# Patient Record
Sex: Female | Born: 1935 | Race: White | Hispanic: No | Smoking: Former smoker
Health system: Southern US, Community
[De-identification: ages and names within clinical notes are randomized; demographics above are authoritative.]

## PROBLEM LIST (undated history)

## (undated) DIAGNOSIS — M7989 Other specified soft tissue disorders: Secondary | ICD-10-CM

## (undated) DIAGNOSIS — C569 Malignant neoplasm of unspecified ovary: Secondary | ICD-10-CM

## (undated) DIAGNOSIS — D649 Anemia, unspecified: Secondary | ICD-10-CM

## (undated) DIAGNOSIS — Z9289 Personal history of other medical treatment: Secondary | ICD-10-CM

## (undated) DIAGNOSIS — I82409 Acute embolism and thrombosis of unspecified deep veins of unspecified lower extremity: Secondary | ICD-10-CM

## (undated) DIAGNOSIS — A0472 Enterocolitis due to Clostridium difficile, not specified as recurrent: Secondary | ICD-10-CM

## (undated) DIAGNOSIS — M1990 Unspecified osteoarthritis, unspecified site: Secondary | ICD-10-CM

## (undated) DIAGNOSIS — I10 Essential (primary) hypertension: Secondary | ICD-10-CM

## (undated) DIAGNOSIS — E039 Hypothyroidism, unspecified: Secondary | ICD-10-CM

## (undated) DIAGNOSIS — J189 Pneumonia, unspecified organism: Secondary | ICD-10-CM

## (undated) DIAGNOSIS — J8410 Pulmonary fibrosis, unspecified: Secondary | ICD-10-CM

## (undated) DIAGNOSIS — J90 Pleural effusion, not elsewhere classified: Principal | ICD-10-CM

## (undated) HISTORY — DX: Pulmonary fibrosis, unspecified: J84.10

## (undated) HISTORY — DX: Hypothyroidism, unspecified: E03.9

## (undated) HISTORY — DX: Personal history of other medical treatment: Z92.89

## (undated) HISTORY — DX: Malignant neoplasm of unspecified ovary: C56.9

## (undated) HISTORY — DX: Essential (primary) hypertension: I10

## (undated) HISTORY — PX: LAPAROSCOPIC HYSTERECTOMY: SHX1926

## (undated) HISTORY — DX: Enterocolitis due to Clostridium difficile, not specified as recurrent: A04.72

## (undated) HISTORY — DX: Anemia, unspecified: D64.9

## (undated) HISTORY — DX: Acute embolism and thrombosis of unspecified deep veins of unspecified lower extremity: I82.409

## (undated) HISTORY — DX: Pleural effusion, not elsewhere classified: J90

## (undated) HISTORY — DX: Pneumonia, unspecified organism: J18.9

## (undated) HISTORY — DX: Other specified soft tissue disorders: M79.89

## (undated) HISTORY — DX: Unspecified osteoarthritis, unspecified site: M19.90

## (undated) HISTORY — PX: APPENDECTOMY: SHX54

---

## 2004-03-05 ENCOUNTER — Other Ambulatory Visit: Admission: RE | Admit: 2004-03-05 | Discharge: 2004-03-05 | Payer: Self-pay | Admitting: Obstetrics and Gynecology

## 2004-03-14 ENCOUNTER — Encounter: Admission: RE | Admit: 2004-03-14 | Discharge: 2004-03-14 | Payer: Self-pay | Admitting: Obstetrics and Gynecology

## 2004-08-29 ENCOUNTER — Encounter: Admission: RE | Admit: 2004-08-29 | Discharge: 2004-08-29 | Payer: Self-pay | Admitting: Internal Medicine

## 2005-03-04 ENCOUNTER — Encounter: Admission: RE | Admit: 2005-03-04 | Discharge: 2005-03-04 | Payer: Self-pay | Admitting: Internal Medicine

## 2005-05-12 ENCOUNTER — Encounter: Admission: RE | Admit: 2005-05-12 | Discharge: 2005-05-12 | Payer: Self-pay | Admitting: Obstetrics and Gynecology

## 2005-11-25 ENCOUNTER — Encounter: Admission: RE | Admit: 2005-11-25 | Discharge: 2005-11-25 | Payer: Self-pay | Admitting: Internal Medicine

## 2006-04-06 HISTORY — PX: REPLACEMENT TOTAL KNEE: SUR1224

## 2006-04-12 ENCOUNTER — Inpatient Hospital Stay (HOSPITAL_COMMUNITY): Admission: RE | Admit: 2006-04-12 | Discharge: 2006-04-16 | Payer: Self-pay | Admitting: Orthopedic Surgery

## 2006-07-15 ENCOUNTER — Other Ambulatory Visit: Admission: RE | Admit: 2006-07-15 | Discharge: 2006-07-15 | Payer: Self-pay | Admitting: Obstetrics and Gynecology

## 2006-07-28 ENCOUNTER — Encounter: Admission: RE | Admit: 2006-07-28 | Discharge: 2006-07-28 | Payer: Self-pay | Admitting: Obstetrics and Gynecology

## 2007-07-06 DIAGNOSIS — C569 Malignant neoplasm of unspecified ovary: Secondary | ICD-10-CM

## 2007-07-06 HISTORY — PX: LAPAROSCOPIC SALPINGOOPHERECTOMY: SUR795

## 2007-07-06 HISTORY — DX: Malignant neoplasm of unspecified ovary: C56.9

## 2007-07-22 ENCOUNTER — Ambulatory Visit (HOSPITAL_COMMUNITY): Admission: RE | Admit: 2007-07-22 | Discharge: 2007-07-22 | Payer: Self-pay | Admitting: Obstetrics and Gynecology

## 2007-08-01 ENCOUNTER — Ambulatory Visit (HOSPITAL_COMMUNITY): Admission: RE | Admit: 2007-08-01 | Discharge: 2007-08-01 | Payer: Self-pay | Admitting: Obstetrics and Gynecology

## 2007-08-05 HISTORY — PX: HERNIA REPAIR: SHX51

## 2007-08-16 ENCOUNTER — Encounter: Admission: RE | Admit: 2007-08-16 | Discharge: 2007-08-16 | Payer: Self-pay | Admitting: Obstetrics and Gynecology

## 2007-08-26 ENCOUNTER — Inpatient Hospital Stay (HOSPITAL_COMMUNITY): Admission: AD | Admit: 2007-08-26 | Discharge: 2007-08-30 | Payer: Self-pay | Admitting: Gastroenterology

## 2007-10-17 ENCOUNTER — Ambulatory Visit: Payer: Self-pay | Admitting: Oncology

## 2007-10-28 LAB — COMPREHENSIVE METABOLIC PANEL
AST: 18 U/L (ref 0–37)
Albumin: 4.1 g/dL (ref 3.5–5.2)
BUN: 16 mg/dL (ref 6–23)
CO2: 24 mEq/L (ref 19–32)
Calcium: 9 mg/dL (ref 8.4–10.5)
Chloride: 101 mEq/L (ref 96–112)
Glucose, Bld: 86 mg/dL (ref 70–99)
Potassium: 4.3 mEq/L (ref 3.5–5.3)

## 2007-10-28 LAB — CBC WITH DIFFERENTIAL/PLATELET
Basophils Absolute: 0 10*3/uL (ref 0.0–0.1)
Eosinophils Absolute: 0.1 10*3/uL (ref 0.0–0.5)
HCT: 33.2 % — ABNORMAL LOW (ref 34.8–46.6)
MCV: 90.5 fL (ref 81.0–101.0)
Platelets: 293 10*3/uL (ref 145–400)
RDW: 16.5 % — ABNORMAL HIGH (ref 11.3–14.5)
WBC: 6.7 10*3/uL (ref 3.9–10.0)
lymph#: 1.9 10*3/uL (ref 0.9–3.3)

## 2008-01-12 ENCOUNTER — Encounter: Admission: RE | Admit: 2008-01-12 | Discharge: 2008-01-12 | Payer: Self-pay | Admitting: Gastroenterology

## 2008-05-02 ENCOUNTER — Ambulatory Visit: Payer: Self-pay | Admitting: Oncology

## 2008-05-09 ENCOUNTER — Ambulatory Visit (HOSPITAL_COMMUNITY): Admission: RE | Admit: 2008-05-09 | Discharge: 2008-05-09 | Payer: Self-pay | Admitting: Oncology

## 2008-08-16 ENCOUNTER — Encounter: Admission: RE | Admit: 2008-08-16 | Discharge: 2008-08-16 | Payer: Self-pay | Admitting: Obstetrics and Gynecology

## 2008-10-15 ENCOUNTER — Ambulatory Visit: Payer: Self-pay | Admitting: Oncology

## 2008-10-17 LAB — COMPREHENSIVE METABOLIC PANEL
ALT: 21 U/L (ref 0–35)
Albumin: 4 g/dL (ref 3.5–5.2)
CO2: 28 mEq/L (ref 19–32)
Calcium: 9 mg/dL (ref 8.4–10.5)
Chloride: 99 mEq/L (ref 96–112)
Creatinine, Ser: 0.71 mg/dL (ref 0.40–1.20)
Potassium: 4.5 mEq/L (ref 3.5–5.3)

## 2008-10-17 LAB — CBC WITH DIFFERENTIAL/PLATELET
BASO%: 0.1 % (ref 0.0–2.0)
Basophils Absolute: 0 10*3/uL (ref 0.0–0.1)
HCT: 35 % (ref 34.8–46.6)
HGB: 11.9 g/dL (ref 11.6–15.9)
MCHC: 34 g/dL (ref 31.5–36.0)
MONO#: 0.7 10*3/uL (ref 0.1–0.9)
NEUT%: 71.9 % (ref 38.4–76.8)
RDW: 15.4 % — ABNORMAL HIGH (ref 11.2–14.5)
WBC: 8.9 10*3/uL (ref 3.9–10.3)
lymph#: 1.7 10*3/uL (ref 0.9–3.3)

## 2008-10-17 LAB — LACTATE DEHYDROGENASE: LDH: 212 U/L (ref 94–250)

## 2008-10-18 ENCOUNTER — Other Ambulatory Visit: Admission: RE | Admit: 2008-10-18 | Discharge: 2008-10-18 | Payer: Self-pay | Admitting: Obstetrics and Gynecology

## 2009-01-11 ENCOUNTER — Ambulatory Visit: Payer: Self-pay | Admitting: Oncology

## 2009-01-15 LAB — CA 125: CA 125: 20.5 U/mL (ref 0.0–30.2)

## 2009-04-16 ENCOUNTER — Ambulatory Visit: Payer: Self-pay | Admitting: Oncology

## 2009-04-22 ENCOUNTER — Inpatient Hospital Stay (HOSPITAL_COMMUNITY): Admission: AD | Admit: 2009-04-22 | Discharge: 2009-05-01 | Payer: Self-pay | Admitting: Internal Medicine

## 2009-04-25 ENCOUNTER — Ambulatory Visit: Payer: Self-pay | Admitting: Infectious Disease

## 2009-05-24 ENCOUNTER — Ambulatory Visit (HOSPITAL_COMMUNITY): Admission: RE | Admit: 2009-05-24 | Discharge: 2009-05-24 | Payer: Self-pay | Admitting: Internal Medicine

## 2009-07-04 ENCOUNTER — Ambulatory Visit: Payer: Self-pay | Admitting: Oncology

## 2009-07-08 LAB — CBC WITH DIFFERENTIAL/PLATELET
BASO%: 0.8 % (ref 0.0–2.0)
EOS%: 2.3 % (ref 0.0–7.0)
LYMPH%: 31.2 % (ref 14.0–49.7)
MCH: 30.6 pg (ref 25.1–34.0)
MCHC: 33.7 g/dL (ref 31.5–36.0)
MCV: 90.7 fL (ref 79.5–101.0)
MONO%: 9.9 % (ref 0.0–14.0)
NEUT%: 55.8 % (ref 38.4–76.8)
Platelets: 286 10*3/uL (ref 145–400)
RBC: 3.77 10*6/uL (ref 3.70–5.45)
WBC: 8.5 10*3/uL (ref 3.9–10.3)

## 2009-07-08 LAB — COMPREHENSIVE METABOLIC PANEL
ALT: 24 U/L (ref 0–35)
Alkaline Phosphatase: 80 U/L (ref 39–117)
Creatinine, Ser: 0.66 mg/dL (ref 0.40–1.20)
Sodium: 135 mEq/L (ref 135–145)
Total Bilirubin: 0.4 mg/dL (ref 0.3–1.2)
Total Protein: 6.3 g/dL (ref 6.0–8.3)

## 2009-07-15 ENCOUNTER — Ambulatory Visit (HOSPITAL_COMMUNITY): Admission: RE | Admit: 2009-07-15 | Discharge: 2009-07-15 | Payer: Self-pay | Admitting: Oncology

## 2009-08-05 ENCOUNTER — Ambulatory Visit: Payer: Self-pay | Admitting: Oncology

## 2009-09-16 ENCOUNTER — Ambulatory Visit: Payer: Self-pay | Admitting: Oncology

## 2009-09-19 ENCOUNTER — Encounter: Admission: RE | Admit: 2009-09-19 | Discharge: 2009-09-19 | Payer: Self-pay | Admitting: Internal Medicine

## 2009-09-27 ENCOUNTER — Encounter: Admission: RE | Admit: 2009-09-27 | Discharge: 2009-09-27 | Payer: Self-pay | Admitting: Internal Medicine

## 2009-10-17 ENCOUNTER — Ambulatory Visit: Payer: Self-pay | Admitting: Oncology

## 2009-10-21 LAB — CBC WITH DIFFERENTIAL/PLATELET
Eosinophils Absolute: 0.1 10*3/uL (ref 0.0–0.5)
HCT: 34.2 % — ABNORMAL LOW (ref 34.8–46.6)
LYMPH%: 27.3 % (ref 14.0–49.7)
MONO#: 0.7 10*3/uL (ref 0.1–0.9)
NEUT#: 4.2 10*3/uL (ref 1.5–6.5)
Platelets: 249 10*3/uL (ref 145–400)
RBC: 3.81 10*6/uL (ref 3.70–5.45)
WBC: 6.9 10*3/uL (ref 3.9–10.3)

## 2009-10-22 LAB — LACTATE DEHYDROGENASE: LDH: 175 U/L (ref 94–250)

## 2009-10-22 LAB — COMPREHENSIVE METABOLIC PANEL
Albumin: 3.9 g/dL (ref 3.5–5.2)
CO2: 26 mEq/L (ref 19–32)
Calcium: 8.9 mg/dL (ref 8.4–10.5)
Chloride: 98 mEq/L (ref 96–112)
Glucose, Bld: 79 mg/dL (ref 70–99)
Sodium: 135 mEq/L (ref 135–145)
Total Bilirubin: 0.3 mg/dL (ref 0.3–1.2)
Total Protein: 6.3 g/dL (ref 6.0–8.3)

## 2009-10-22 LAB — CA 125: CA 125: 20.3 U/mL (ref 0.0–30.2)

## 2010-01-23 ENCOUNTER — Ambulatory Visit: Payer: Self-pay | Admitting: Oncology

## 2010-03-15 ENCOUNTER — Emergency Department (HOSPITAL_COMMUNITY)
Admission: EM | Admit: 2010-03-15 | Discharge: 2010-03-16 | Payer: Self-pay | Source: Home / Self Care | Admitting: Emergency Medicine

## 2010-04-18 ENCOUNTER — Inpatient Hospital Stay (HOSPITAL_COMMUNITY)
Admission: RE | Admit: 2010-04-18 | Discharge: 2010-04-22 | Payer: Self-pay | Source: Home / Self Care | Attending: Orthopedic Surgery | Admitting: Orthopedic Surgery

## 2010-04-21 LAB — CBC
HCT: 34.7 % — ABNORMAL LOW (ref 36.0–46.0)
HCT: 26.9 % — ABNORMAL LOW (ref 36.0–46.0)
HCT: 26.2 % — ABNORMAL LOW (ref 36.0–46.0)
Hemoglobin: 11.6 g/dL — ABNORMAL LOW (ref 12.0–15.0)
Hemoglobin: 9 g/dL — ABNORMAL LOW (ref 12.0–15.0)
Hemoglobin: 8.6 g/dL — ABNORMAL LOW (ref 12.0–15.0)
MCH: 31.3 pg (ref 26.0–34.0)
MCH: 30.9 pg (ref 26.0–34.0)
MCH: 30.7 pg (ref 26.0–34.0)
MCHC: 33.4 g/dL (ref 30.0–36.0)
MCHC: 33.5 g/dL (ref 30.0–36.0)
MCHC: 32.8 g/dL (ref 30.0–36.0)
MCV: 93.5 fL (ref 78.0–100.0)
MCV: 92.4 fL (ref 78.0–100.0)
MCV: 93.6 fL (ref 78.0–100.0)
Platelets: 365 10*3/uL (ref 150–400)
Platelets: 246 10*3/uL (ref 150–400)
Platelets: 242 10*3/uL (ref 150–400)
RBC: 3.71 MIL/uL — ABNORMAL LOW (ref 3.87–5.11)
RBC: 2.91 MIL/uL — ABNORMAL LOW (ref 3.87–5.11)
RBC: 2.8 MIL/uL — ABNORMAL LOW (ref 3.87–5.11)
RDW: 16.9 % — ABNORMAL HIGH (ref 11.5–15.5)
RDW: 16.8 % — ABNORMAL HIGH (ref 11.5–15.5)
RDW: 16.9 % — ABNORMAL HIGH (ref 11.5–15.5)
WBC: 10.1 10*3/uL (ref 4.0–10.5)
WBC: 9.4 10*3/uL (ref 4.0–10.5)
WBC: 9.2 10*3/uL (ref 4.0–10.5)

## 2010-04-21 LAB — COMPREHENSIVE METABOLIC PANEL
ALT: 24 U/L (ref 0–35)
AST: 21 U/L (ref 0–37)
Albumin: 3.5 g/dL (ref 3.5–5.2)
Alkaline Phosphatase: 95 U/L (ref 39–117)
BUN: 22 mg/dL (ref 6–23)
CO2: 27 mEq/L (ref 19–32)
Calcium: 8.8 mg/dL (ref 8.4–10.5)
Chloride: 96 mEq/L (ref 96–112)
Creatinine, Ser: 0.66 mg/dL (ref 0.4–1.2)
GFR calc Af Amer: >60 mL/min (ref >60)
GFR calc non Af Amer: >60 mL/min (ref >60)
Glucose, Bld: 98 mg/dL (ref 70–99)
Potassium: 4.3 mEq/L (ref 3.5–5.1)
Sodium: 132 mEq/L — ABNORMAL LOW (ref 135–145)
Total Bilirubin: 0.5 mg/dL (ref 0.3–1.2)
Total Protein: 6.8 g/dL (ref 6.0–8.3)

## 2010-04-21 LAB — PROTIME-INR
INR: 0.95 (ref 0.00–1.49)
INR: 1.07 (ref 0.00–1.49)
INR: 1.05 (ref 0.00–1.49)
Prothrombin Time: 12.9 seconds (ref 11.6–15.2)
Prothrombin Time: 14.1 seconds (ref 11.6–15.2)
Prothrombin Time: 13.9 seconds (ref 11.6–15.2)

## 2010-04-21 LAB — BASIC METABOLIC PANEL
BUN: 15 mg/dL (ref 6–23)
BUN: 15 mg/dL (ref 6–23)
CO2: 26 mEq/L (ref 19–32)
CO2: 23 mEq/L (ref 19–32)
Calcium: 7.7 mg/dL — ABNORMAL LOW (ref 8.4–10.5)
Calcium: 7.8 mg/dL — ABNORMAL LOW (ref 8.4–10.5)
Chloride: 103 mEq/L (ref 96–112)
Chloride: 100 mEq/L (ref 96–112)
Creatinine, Ser: 0.64 mg/dL (ref 0.4–1.2)
Creatinine, Ser: 0.59 mg/dL (ref 0.4–1.2)
GFR calc Af Amer: >60 mL/min (ref >60)
GFR calc Af Amer: >60 mL/min (ref >60)
GFR calc non Af Amer: >60 mL/min (ref >60)
GFR calc non Af Amer: >60 mL/min (ref >60)
Glucose, Bld: 157 mg/dL — ABNORMAL HIGH (ref 70–99)
Glucose, Bld: 120 mg/dL — ABNORMAL HIGH (ref 70–99)
Potassium: 4.2 mEq/L (ref 3.5–5.1)
Potassium: 4.1 mEq/L (ref 3.5–5.1)
Sodium: 135 mEq/L (ref 135–145)
Sodium: 130 mEq/L — ABNORMAL LOW (ref 135–145)

## 2010-04-21 LAB — URINALYSIS, ROUTINE W REFLEX MICROSCOPIC
Bilirubin Urine: NEGATIVE
Hgb urine dipstick: NEGATIVE
Ketones, ur: NEGATIVE mg/dL
Nitrite: POSITIVE — AB
Protein, ur: NEGATIVE mg/dL
Specific Gravity, Urine: 1.018 (ref 1.005–1.030)
Urine Glucose, Fasting: NEGATIVE mg/dL
Urobilinogen, UA: 0.2 mg/dL (ref 0.0–1.0)
pH: 6.5 (ref 5.0–8.0)

## 2010-04-21 LAB — URINE MICROSCOPIC-ADD ON

## 2010-04-21 LAB — TYPE AND SCREEN
ABO/RH(D): A POS
Antibody Screen: NEGATIVE

## 2010-04-21 LAB — APTT: aPTT: 24 seconds (ref 24–37)

## 2010-04-21 LAB — SURGICAL PCR SCREEN
MRSA, PCR: NEGATIVE
Staphylococcus aureus: NEGATIVE

## 2010-04-23 LAB — CBC
HCT: 24.6 % — ABNORMAL LOW (ref 36.0–46.0)
HCT: 25.2 % — ABNORMAL LOW (ref 36.0–46.0)
Hemoglobin: 8.2 g/dL — ABNORMAL LOW (ref 12.0–15.0)
Hemoglobin: 8.3 g/dL — ABNORMAL LOW (ref 12.0–15.0)
MCH: 30.9 pg (ref 26.0–34.0)
MCH: 30.6 pg (ref 26.0–34.0)
MCHC: 33.3 g/dL (ref 30.0–36.0)
MCHC: 32.9 g/dL (ref 30.0–36.0)
MCV: 92.8 fL (ref 78.0–100.0)
MCV: 93 fL (ref 78.0–100.0)
Platelets: 223 10*3/uL (ref 150–400)
Platelets: 259 10*3/uL (ref 150–400)
RBC: 2.65 MIL/uL — ABNORMAL LOW (ref 3.87–5.11)
RBC: 2.71 MIL/uL — ABNORMAL LOW (ref 3.87–5.11)
RDW: 17 % — ABNORMAL HIGH (ref 11.5–15.5)
RDW: 16.9 % — ABNORMAL HIGH (ref 11.5–15.5)
WBC: 9.4 10*3/uL (ref 4.0–10.5)
WBC: 8.2 10*3/uL (ref 4.0–10.5)

## 2010-04-23 LAB — PROTIME-INR
INR: 1.05 (ref 0.00–1.49)
INR: 1.2 (ref 0.00–1.49)
Prothrombin Time: 13.9 seconds (ref 11.6–15.2)
Prothrombin Time: 15.4 seconds — ABNORMAL HIGH (ref 11.6–15.2)

## 2010-04-23 LAB — BASIC METABOLIC PANEL
BUN: 18 mg/dL (ref 6–23)
CO2: 25 mEq/L (ref 19–32)
Calcium: 8 mg/dL — ABNORMAL LOW (ref 8.4–10.5)
Chloride: 101 mEq/L (ref 96–112)
Creatinine, Ser: 0.58 mg/dL (ref 0.4–1.2)
GFR calc Af Amer: >60 mL/min (ref >60)
GFR calc non Af Amer: >60 mL/min (ref >60)
Glucose, Bld: 105 mg/dL — ABNORMAL HIGH (ref 70–99)
Potassium: 4.3 mEq/L (ref 3.5–5.1)
Sodium: 132 mEq/L — ABNORMAL LOW (ref 135–145)

## 2010-04-24 ENCOUNTER — Ambulatory Visit: Payer: Self-pay | Admitting: Oncology

## 2010-04-27 ENCOUNTER — Encounter: Payer: Self-pay | Admitting: Internal Medicine

## 2010-04-27 ENCOUNTER — Encounter: Payer: Self-pay | Admitting: Oncology

## 2010-05-07 NOTE — Discharge Summary (Signed)
NAMEDANILA, Heidi Pratt             ACCOUNT NO.:  [id number]  MEDICAL RECORD NO.:  [id number]          PATIENT TYPE:  INP  LOCATION:  1525                         FACILITY:  [facility]  PHYSICIAN:  Ollen Gross, M.D.    DATE OF BIRTH:  08/25/1918  DATE OF ADMISSION:  04/18/2010 DATE OF DISCHARGE:  04/21/2010                        DISCHARGE SUMMARY - REFERRING   ADMISSION DIAGNOSES: 1. Osteoarthritis, left hip. 2. Rheumatoid arthritis. 3. History of ovarian cancer. 4. Hypertension. 5. Past history of pneumonia in January 2011. 6. Arthritis.  DISCHARGE DIAGNOSES: 1. Osteoarthritis of left hip, status post left total hip replacement     arthroplasty. 2. Postop acute blood loss anemia, did not require transfusion. 3. Postop hyponatremia, improving. 4. Rheumatoid arthritis. 5. History of ovarian cancer. 6. Hypertension. 7. Past history of pneumonia in January 2011. 8. Arthritis.  PROCEDURE:  April 18, 2010, left total hip.  Surgeon was Ollen Gross, M.D.  Assistant, Alexzandrew L. Perkins, PA-C.  Spinal anesthesia.  CONSULTS:  None.  HISTORY:  Heidi Pratt is an 77 year old female with end-stage arthritis of the left hip.  She has had a rapidly progressive arthritic change with worsening pain and dysfunction.  She has had 2 intraarticular injections without much benefit, now presents for total hip arthroplasty.  LABORATORY DATA:  Preop CBC showed a hemoglobin of 11.6, hematocrit of 34.7, white cell count 10.1, and platelets 365.  PT/INR 12.9 and 0.95 with PTT of 24.  Chemistry panel on admission, slightly low sodium of 132.  Remaining Chem panel all within normal limits.  Preop UA was cloudy with positive nitrite, large leukocyte esterase, few squamous, many bacteria.  This was treated preoperatively.  Blood group type A+. Nasal swabs were negative for Staph aureus and negative for MRSA. Serial CBCs were followed.  Hemoglobin dropped down to 9 postop and then to 8.6.   Last H and H was 8.2 and 24.6.  Serial pro times followed per Coumadin protocol.  Last noted PT/INR was 13.9 and 1.05.  Serial BMETs were followed.  Sodium went up from 132 to 135, it dropped down to 130, then it came back up to her preop level of 132.  Remaining BMETs for 3 days were within normal limits.  RADIOGRAPHY:  X-rays, 2-view chest on April 16, 2010, borderline cardiomegaly, currently no congestive heart failure or active disease. A healed or healing fracture of the right 5th rib was noted.  Left hip film preop, severe left hip osteoarthritis.  HOSPITAL COURSE:  The patient was admitted to [facility], taken to the operating room and underwent the above-stated procedure without complication.  The patient tolerated the procedure well, later transferred to recovery room in orthopedic floor, started on p.o. and IV analgesic pain control following surgery.  Doing pretty well on the morning of day 1, started to get up out of bed.  Hemovac drain was pulled.  She was initially placed on a PCA for postop pain control and that was discontinued on day 1, started getting up with therapy and was placed on Coumadin for DVT prophylaxis.  There are a couple of days of Lovenox for coverage.  By day 2,  she was doing a little bit better.  She had a little bit of lightheadedness with day 1, but it could have been the IV narcotics.  No complaints on day 2.  Hemoglobin was down to 8.6. She was asymptomatic with this on this day.  We kept her on iron supplementation.  Her sodium had dropped though down to 130, it is probably a dilutional component.  She had a little bit of volume overload, so we gave her a p.o. dose of Lasix to help with diuresis. She started diuresing her fluids a little bit better, hep-locked her IV. She was seen on day 3 and she was doing well, feeling good, seen in rounds by Dr. Lequita Halt.  We are trying to get her over to [facility] as she was doing well enough.   She has had no complaints.  If the bed was available, we would allow her to be transferred over at that time.  DISCHARGE/PLAN:  The patient will be transferred over to [facility] on April 21, 2010.  Discharge diagnoses, please see above.  Current medications at the time of transfer include: 1. Levothyroxine 125 mcg p.o. q.a.m. 2. Coumadin protocol.  Please titrate the Coumadin level for a target     INR between 2.0 and 3.0.  She needs to be on Coumadin for 3 weeks     from the date of surgery. 3. Colace 100 mg p.o. b.i.d. 4. Lovenox 40 mg subcutaneous injection daily.  Continue Lovenox for 3     more days, then discontinue the Lovenox. 5. Exforge 5/320 p.o. q.a.m. 6. Ambien 5 mg p.o. q.h.s. p.r.n. sleep. 7. Tylenol 325 one or two every 4-6 hours as needed for mild pain,     temperature or headache. 8. Dulcolax tabs 10 mg p.o. daily p.r.n. constipation. 9. Laxative of choice. 10.Enema of choice. 11.Robaxin 500 mg p.o. q.6 to 8 hours p.r.n. spasm. 12.Percocet 5 mg 1-2 tablets every 4 hours as needed for moderate     pain. 13.Zofran 4 mg p.o. q.6 h. p.r.n. nausea.  DIET:  Heart-healthy diet.ACTIVITY:  She is partial weightbearing 25% to 50% to the left lower extremity.  Hip precautions.  Total hip protocol.  Gait training, ambulation, ADLs and strengthening exercises.  PT and OT.  Please note, the patient may start showering, however, do not submerge the incision under water.  FOLLOWUP:  She needs to follow up with Dr. Lequita Halt in the office on Thursday, January 26th.  Please contact the office at [phone] to help arrange appointment, followup care and transfer the patient.  DISPOSITION:  [facility].  CONDITION UPON DISCHARGE:  Improving.  Labs pending.  Would recommend doing a repeat CBC on Thursday, the 19th of this week just for a followup check.     Alexzandrew L. Julien Girt, P.A.C.   ______________________________ Ollen Gross, M.D.    ALP/MEDQ   D:  04/21/2010  T:  04/21/2010  Job:  [id number]  cc:   Thora Lance, M.D. Fax: [phone]  Electronically Signed by Patrica Duel P.A.C. on 04/24/2010 11:11:05 AM Electronically Signed by Ollen Gross M.D. on 05/07/2010 03:34:30 PM

## 2010-05-07 NOTE — Discharge Summary (Signed)
  Heidi Pratt, Heidi Pratt             ACCOUNT NO.:  [id number]  MEDICAL RECORD NO.:  [id number]          PATIENT TYPE:  INP  LOCATION:  1525                         [facility]ACILITY:  [facility]  PHYSICIAN:  Ollen Gross, M.D.    DATE OF BIRTH:  28-Aug-1918  DATE OF ADMISSION:  04/18/2010 DATE OF DISCHARGE:  04/22/2010                        DISCHARGE SUMMARY - REFERRING   ADDENDUM:  ADMITTING DIAGNOSES: 1. Osteoarthritis left hip. 2. Rheumatoid arthritis. 3. History of ovarian cancer. 4. Hypertension. 5. Past history of pneumonia, July 2011. 6. Arthritis.  DISCHARGE DIAGNOSES: 1. Osteoarthritis left hip, status post left total hip replacement     arthroplasty. 2. Postop acute blood loss anemia, did not require transfusion,     stable. 3. Postop hyponatremia, improving. 4. Mild urinary retention, resolved. 5. Rheumatoid arthritis. 6. History of ovarian cancer. 7. Hypertension. 8. Past history of pneumonia, July 2011.9. Arthritis.  PROCEDURE:  Left total hip.  ADDITIONAL LABS:  Her last CBC noted the hemoglobin was stable and back up to 8.3 with a hematocrit of 25.2, platelets 259,000, normal white count of 8.2.  Her last noted PT/INR at the time of discharge was 15.4 and 1.20.  HOSPITAL COURSE ADDENDUM:  The patient was originally set up to go to [facility] yesterday, April 21, 2010; however after the Foley was removed yesterday morning, she went through the afternoon without any voiding.  We held her discharge because of some urinary retention.  Once her pessary was replaced, she was able to void yesterday evening.  She was doing much better on morning rounds on this morning January 17, doing well, no complaints, voiding, and was able to be transferred at that time.  DISCHARGE PLAN:  Please see previous discharge summary for all.  PLAN:  Please note the patient may follow up either on Thursday, January 26 or Friday, January 27, please contact the office at  [phone].  DISPOSITION:  [facility].  CONDITION ON DISCHARGE:  Improving.  Please note, we do recommend doing a followup CBC on Thursday, the 19th and please send results to Dr. Ollen Gross.  You may fax him over at [phone].     Alexzandrew L. Julien Girt, P.A.C.   ______________________________ Ollen Gross, M.D.    ALP/MEDQ  D:  04/22/2010  T:  04/22/2010  Job:  [id number]  cc:   Thora Lance, M.D. Fax: [phone]  Electronically Signed by Patrica Duel P.A.C. on 04/24/2010 11:11:14 AM Electronically Signed by Ollen Gross M.D. on 05/07/2010 03:34:33 PM

## 2010-05-14 ENCOUNTER — Other Ambulatory Visit: Payer: Self-pay | Admitting: Oncology

## 2010-05-14 ENCOUNTER — Encounter (HOSPITAL_BASED_OUTPATIENT_CLINIC_OR_DEPARTMENT_OTHER): Payer: Medicare Other | Admitting: Oncology

## 2010-05-14 DIAGNOSIS — J984 Other disorders of lung: Secondary | ICD-10-CM

## 2010-06-16 ENCOUNTER — Other Ambulatory Visit: Payer: Self-pay | Admitting: Oncology

## 2010-06-16 ENCOUNTER — Encounter (HOSPITAL_BASED_OUTPATIENT_CLINIC_OR_DEPARTMENT_OTHER): Payer: Medicare Other | Admitting: Oncology

## 2010-06-16 DIAGNOSIS — J984 Other disorders of lung: Secondary | ICD-10-CM

## 2010-06-16 DIAGNOSIS — R978 Other abnormal tumor markers: Secondary | ICD-10-CM

## 2010-06-16 DIAGNOSIS — C569 Malignant neoplasm of unspecified ovary: Secondary | ICD-10-CM

## 2010-06-16 DIAGNOSIS — Z8543 Personal history of malignant neoplasm of ovary: Secondary | ICD-10-CM

## 2010-06-16 LAB — CBC WITH DIFFERENTIAL/PLATELET
BASO%: 0.3 % (ref 0.0–2.0)
Basophils Absolute: 0 10*3/uL (ref 0.0–0.1)
HCT: 28.1 % — ABNORMAL LOW (ref 34.8–46.6)
HGB: 9.5 g/dL — ABNORMAL LOW (ref 11.6–15.9)
LYMPH%: 18.5 % (ref 14.0–49.7)
MCH: 30.3 pg (ref 25.1–34.0)
MCHC: 33.7 g/dL (ref 31.5–36.0)
MONO#: 0.7 10*3/uL (ref 0.1–0.9)
NEUT%: 70.5 % (ref 38.4–76.8)
Platelets: 301 10*3/uL (ref 145–400)
lymph#: 1.4 10*3/uL (ref 0.9–3.3)

## 2010-06-16 LAB — COMPREHENSIVE METABOLIC PANEL
ALT: 15 U/L (ref 0–35)
BUN: 20 mg/dL (ref 6–23)
CO2: 24 mEq/L (ref 19–32)
Calcium: 8.6 mg/dL (ref 8.4–10.5)
Chloride: 100 mEq/L (ref 96–112)
Creatinine, Ser: 0.7 mg/dL (ref 0.40–1.20)
Total Bilirubin: 0.3 mg/dL (ref 0.3–1.2)

## 2010-06-16 LAB — CA 125: CA 125: 27.4 U/mL (ref 0.0–30.2)

## 2010-06-16 LAB — LACTATE DEHYDROGENASE: LDH: 188 U/L (ref 94–250)

## 2010-06-20 ENCOUNTER — Encounter (HOSPITAL_BASED_OUTPATIENT_CLINIC_OR_DEPARTMENT_OTHER): Payer: BC Managed Care – HMO | Admitting: Oncology

## 2010-06-22 LAB — BASIC METABOLIC PANEL
BUN: 18 mg/dL (ref 6–23)
BUN: 24 mg/dL — ABNORMAL HIGH (ref 6–23)
BUN: 4 mg/dL — ABNORMAL LOW (ref 6–23)
BUN: 5 mg/dL — ABNORMAL LOW (ref 6–23)
CO2: 19 mEq/L (ref 19–32)
CO2: 19 mEq/L (ref 19–32)
CO2: 19 mEq/L (ref 19–32)
Calcium: 7.2 mg/dL — ABNORMAL LOW (ref 8.4–10.5)
Calcium: 7.4 mg/dL — ABNORMAL LOW (ref 8.4–10.5)
Calcium: 8.1 mg/dL — ABNORMAL LOW (ref 8.4–10.5)
Chloride: 91 mEq/L — ABNORMAL LOW (ref 96–112)
Chloride: 92 mEq/L — ABNORMAL LOW (ref 96–112)
Chloride: 93 mEq/L — ABNORMAL LOW (ref 96–112)
Chloride: 93 mEq/L — ABNORMAL LOW (ref 96–112)
Chloride: 98 mEq/L (ref 96–112)
Creatinine, Ser: 0.44 mg/dL (ref 0.4–1.2)
Creatinine, Ser: 0.45 mg/dL (ref 0.4–1.2)
Creatinine, Ser: 0.46 mg/dL (ref 0.4–1.2)
Creatinine, Ser: 0.88 mg/dL (ref 0.4–1.2)
GFR calc Af Amer: >60 mL/min (ref >60)
GFR calc Af Amer: >60 mL/min (ref >60)
GFR calc Af Amer: >60 mL/min (ref >60)
GFR calc Af Amer: >60 mL/min (ref >60)
GFR calc Af Amer: >60 mL/min (ref >60)
GFR calc Af Amer: >60 mL/min (ref >60)
GFR calc non Af Amer: >60 mL/min (ref >60)
GFR calc non Af Amer: >60 mL/min (ref >60)
GFR calc non Af Amer: >60 mL/min (ref >60)
GFR calc non Af Amer: >60 mL/min (ref >60)
GFR calc non Af Amer: >60 mL/min (ref >60)
GFR calc non Af Amer: >60 mL/min (ref >60)
Glucose, Bld: 101 mg/dL — ABNORMAL HIGH (ref 70–99)
Glucose, Bld: 105 mg/dL — ABNORMAL HIGH (ref 70–99)
Glucose, Bld: 120 mg/dL — ABNORMAL HIGH (ref 70–99)
Glucose, Bld: 124 mg/dL — ABNORMAL HIGH (ref 70–99)
Glucose, Bld: 95 mg/dL (ref 70–99)
Potassium: 2.9 mEq/L — ABNORMAL LOW (ref 3.5–5.1)
Potassium: 3 mEq/L — ABNORMAL LOW (ref 3.5–5.1)
Potassium: 3.3 mEq/L — ABNORMAL LOW (ref 3.5–5.1)
Potassium: 3.6 mEq/L (ref 3.5–5.1)
Potassium: 3.8 mEq/L (ref 3.5–5.1)
Potassium: 4.8 mEq/L (ref 3.5–5.1)
Sodium: 120 mEq/L — ABNORMAL LOW (ref 135–145)
Sodium: 122 mEq/L — ABNORMAL LOW (ref 135–145)
Sodium: 122 mEq/L — ABNORMAL LOW (ref 135–145)
Sodium: 123 mEq/L — ABNORMAL LOW (ref 135–145)
Sodium: 124 mEq/L — ABNORMAL LOW (ref 135–145)

## 2010-06-22 LAB — URINALYSIS, ROUTINE W REFLEX MICROSCOPIC
Glucose, UA: NEGATIVE mg/dL
Protein, ur: 30 mg/dL — AB
Specific Gravity, Urine: 1.014 (ref 1.005–1.030)
pH: 5.5 (ref 5.0–8.0)

## 2010-06-22 LAB — CORTISOL-AM, BLOOD: Cortisol - AM: 40.3 ug/dL — ABNORMAL HIGH (ref 4.3–22.4)

## 2010-06-22 LAB — CBC
HCT: 31.1 % — ABNORMAL LOW (ref 36.0–46.0)
HCT: 36.5 % (ref 36.0–46.0)
Hemoglobin: 10.1 g/dL — ABNORMAL LOW (ref 12.0–15.0)
Hemoglobin: 10.6 g/dL — ABNORMAL LOW (ref 12.0–15.0)
Hemoglobin: 11.5 g/dL — ABNORMAL LOW (ref 12.0–15.0)
Hemoglobin: 11.9 g/dL — ABNORMAL LOW (ref 12.0–15.0)
Hemoglobin: 12.4 g/dL (ref 12.0–15.0)
MCHC: 33.6 g/dL (ref 30.0–36.0)
MCHC: 34.1 g/dL (ref 30.0–36.0)
MCHC: 34.2 g/dL (ref 30.0–36.0)
MCV: 91.5 fL (ref 78.0–100.0)
MCV: 92 fL (ref 78.0–100.0)
Platelets: 311 10*3/uL (ref 150–400)
Platelets: 340 10*3/uL (ref 150–400)
RBC: 3.19 MIL/uL — ABNORMAL LOW (ref 3.87–5.11)
RBC: 3.22 MIL/uL — ABNORMAL LOW (ref 3.87–5.11)
RBC: 3.68 MIL/uL — ABNORMAL LOW (ref 3.87–5.11)
RBC: 3.77 MIL/uL — ABNORMAL LOW (ref 3.87–5.11)
RDW: 15.1 % (ref 11.5–15.5)
RDW: 15.1 % (ref 11.5–15.5)
RDW: 15.2 % (ref 11.5–15.5)
RDW: 15.6 % — ABNORMAL HIGH (ref 11.5–15.5)
WBC: 11.5 10*3/uL — ABNORMAL HIGH (ref 4.0–10.5)
WBC: 12.4 10*3/uL — ABNORMAL HIGH (ref 4.0–10.5)
WBC: 22.4 10*3/uL — ABNORMAL HIGH (ref 4.0–10.5)

## 2010-06-22 LAB — CLOSTRIDIUM DIFFICILE EIA: C difficile Toxins A+B, EIA: 19

## 2010-06-22 LAB — LEGIONELLA ANTIGEN, URINE

## 2010-06-22 LAB — SODIUM, URINE, RANDOM: Sodium, Ur: 105 mEq/L

## 2010-06-22 LAB — HEMOCCULT GUIAC POC 1CARD (OFFICE): Fecal Occult Bld: POSITIVE

## 2010-06-22 LAB — OSMOLALITY: Osmolality: 245 mOsm/kg — CL (ref 275–300)

## 2010-06-22 LAB — STOOL CULTURE

## 2010-06-22 LAB — URINE MICROSCOPIC-ADD ON

## 2010-06-22 LAB — URINE CULTURE

## 2010-06-22 LAB — MAGNESIUM: Magnesium: 1.9 mg/dL (ref 1.5–2.5)

## 2010-06-22 LAB — BRAIN NATRIURETIC PEPTIDE: Pro B Natriuretic peptide (BNP): 183 pg/mL — ABNORMAL HIGH (ref 0.0–100.0)

## 2010-06-22 LAB — OSMOLALITY, URINE: Osmolality, Ur: 388 mOsm/kg — ABNORMAL LOW (ref 390–1090)

## 2010-06-25 DIAGNOSIS — Z8543 Personal history of malignant neoplasm of ovary: Secondary | ICD-10-CM

## 2010-06-25 LAB — GLUCOSE, SEROUS FLUID: Glucose, Fluid: 115 mg/dL

## 2010-06-25 LAB — PROTEIN, BODY FLUID: Total protein, fluid: 3.8 g/dL

## 2010-06-25 LAB — BODY FLUID CELL COUNT WITH DIFFERENTIAL
Neutrophil Count, Fluid: 12 % (ref 0–25)
Total Nucleated Cell Count, Fluid: 1150 cu mm — ABNORMAL HIGH (ref 0–1000)

## 2010-06-25 LAB — PATHOLOGIST SMEAR REVIEW

## 2010-08-04 ENCOUNTER — Other Ambulatory Visit: Payer: Self-pay | Admitting: Internal Medicine

## 2010-08-04 DIAGNOSIS — R52 Pain, unspecified: Secondary | ICD-10-CM

## 2010-08-04 DIAGNOSIS — R609 Edema, unspecified: Secondary | ICD-10-CM

## 2010-08-05 ENCOUNTER — Ambulatory Visit
Admission: RE | Admit: 2010-08-05 | Discharge: 2010-08-05 | Disposition: A | Discharge status: Home / Self Care | Payer: Medicare Other | Source: Ambulatory Visit | Attending: Internal Medicine | Admitting: Internal Medicine

## 2010-08-05 DIAGNOSIS — R609 Edema, unspecified: Secondary | ICD-10-CM

## 2010-08-05 DIAGNOSIS — R52 Pain, unspecified: Secondary | ICD-10-CM

## 2010-08-19 NOTE — Op Note (Signed)
[name], Heidi Pratt             ACCOUNT NO.:  [id number]   MEDICAL RECORD NO.:  [id number]           PATIENT TYPE:   LOCATION:                                 FACILITY:   PHYSICIAN:  Gabrielle Dare. Janee Morn, M.D.DATE OF BIRTH:  1918/11/22   DATE OF PROCEDURE:  08/27/2007  DATE OF DISCHARGE:                               OPERATIVE REPORT   PREOPERATIVE DIAGNOSIS:  Acute trocar site hernia with small-bowel  obstruction.   POSTOPERATIVE DIAGNOSIS:  1. Acute trocar site hernia with small-bowel obstruction.  2. Small-bowel viable procedures repair trocar site hernia.   SURGEON:  Gabrielle Dare. Janee Morn, M.D.   HISTORY OF PRESENT ILLNESS:  [name] is an 77 year old white  female who is 4 days status post laparoscopic left salpingo-oophorectomy  at port site for ovarian cancer.  She has had increasing nausea and  vomiting postoperatively.  She was admitted last night with electrolyte  disturbance and appearance of small-bowel obstruction on abdominal  films.  A CT scan of the abdomen and pelvis was obtained today,  demonstrating an acute trocar site hernia at her suprapubic trocar site  containing small bowel, and causing small bowel obstruction.  She was  brought emergently to the operating room for repair.   PROCEDURE IN DETAIL:  Informed consent was obtained from the patient and  I spoke in detail with her and her son.  She was identified in preop  holding area.  She received intravenous antibiotics.  She was brought to  the operating room.  General anesthesia was administered.  Her abdomen  was prepped and draped in a sterile fashion.  The suprapubic trocar site  was gently opened with blunt dissection.  Small subcuticular suture was  removed.  Subcutaneous tissues were dissected down gently with blunt  dissection.  The incision was opened a little bit on either side to  enlarge it, and being care to protect underlying tissues.  The loop of  small bowel was easily located.  It was a  little bit erythematous, but  it was completely viable.  With some gentle dissection it was reduced  back into the abdomen without difficulty.  The fascia was  circumferentially cleared away and the fascial defect was about 1.4 cm  in length and this was then closed with interrupted #1 PDS sutures with  care not to trap any intra-abdominal contents.  Sutures were placed and  then tied at the end achieving excellent closure of the defect.  Subcutaneous tissues were copiously irrigated with 0.25% Marcaine with  epinephrine was injected for postoperative pain relief.  Subcutaneous  tissues were approximated with interrupted 3-0 Vicryl and the skin was  closed with running 4-0 Monocryl subcuticular stitch.  The wound was  then closed with Dermabond.  The patient tolerated procedure well  without apparent complications, and was taken recovery in stable  condition.      Gabrielle Dare Janee Morn, M.D.  Electronically Signed    BET/MEDQ  D:  08/27/2007  T:  08/27/2007  Job:  [id number]   cc:   Tasia Catchings, M.D.

## 2010-08-19 NOTE — H&P (Signed)
NAMENYJAH, SCHWAKE NO.:  [id number]   MEDICAL RECORD NO.:  [id number]          PATIENT TYPE:  INP   LOCATION:  5128                         FACILITY:  [facility]   PHYSICIAN:  Tasia Catchings, M.D.   DATE OF BIRTH:  12/12/1918   DATE OF ADMISSION:  08/26/2007  DATE OF DISCHARGE:                              HISTORY & PHYSICAL   HISTORY:  Ms. Enge is an 77 year old patient who on routine pelvic  exam was discovered to have a mass in her left adnexa.  Three days ago  she had a laparoscopic resection of this and it turned out to be a low-  grade class II ovarian cancer.  The actual final pathology is not back.  This was performed laparoscopically at [facility], and  postoperatively she was transferred back to the facility she lives in  [place]), and was cared for by the rehabilitation section.  Almost  immediately, she began having difficulty eating with nausea and  vomiting.  This also resulted in difficulty voiding.  She had a Foley  catheter inserted and then 24 hours later it was removed, but  unfortunately still had difficulty voiding.  The nausea and vomiting  continued and the day before admission we started IV fluids, and despite  the fact that this produced a decent urine output.  The next morning,  she was still nauseated and at that point had a serum sodium of 128,  potassium of 3.1.  It was felt that she was not a candidate for further  treatment in the rehab's facility and that she needed more acute care  and setting and therefore she was transferred here.  I have been in  discussion with her son, Dr. Vic Blackbird and her surgeon, Dr.  Seward Carol in [place] and everyone agrees with this plan.   PAST MEDICAL HISTORY:  1. Includes hypertension for which she had been taking Diovan 320 mg      daily, but this of course will be placed on hold.  2. She has DJD and is status post a recent left knee replacement which      has done very well.  3. She has osteoporosis on bone scan in 2007.  Is now taking Actonel      35 mg a week without GI side effects, although, we are going to      hold that of course while she is in the hospital.  4. She has a long history of a prolapsed bladder and has been treated      successfully with a pessary.  She is also on hormone replacement      therapy although this was discontinued after the discovery of her      tumor.  5. She had a meningioma, followed up with a 60-month later MRI in 2006      without change and it was felt that this was an incidental finding.      It was discovered at a time when she had a severe headache which      has not reoccurred.  6. She has hypothyroidism successfully treated with Synthroid 0.05  mg      daily.  7. Finally she has allergic rhinitis.   ALLERGIES:  ACE INHIBITORS produce cough, BIAXIN produces nausea,  NORVASC produced edema, and she is highly allergic to CRAB MEAT.  She  has also had hyponatremia due to DIURETICS.   Smoking 30-pack years but none since 1985.  Alcohol 1 beverage nightly.  Caffeine 2 cups daily.   MEDICATIONS:  She is on Diovan, on hold 320 mg daily, Actonel 35 mg  weekly, Synthroid 0.05 mg daily, and multiple vitamins.   PAST SURGICAL HISTORY:  Previous surgery includes the recent  laparoscopic left salpingo-oophorectomy, tonsillectomy, appendectomy,  TAH, wisdom teeth removed, and left knee replacement.  Injury:  Fractured left wrist.   FAMILY HISTORY:  Father died at age 57 of ASHD.  Mother died at age 41  of old age.  One brother died at age 66 of a massive heart attack.  No  sisters.  She has 2 children, the son with hypertension and daughter  with uterine fibroids.   SOCIAL HISTORY:  Native of [address] [place], who lived in  [place] for many years before moving to [place] in 2005.  She  was widowed in 2003.   PHYSICAL EXAMINATION:  VITALS:  Blood pressure 110/70, pulse 72 and  regular.  HEENT:   Mouth appears a little dry.  NECK:  Supple without nodes or JVD.  CHEST:  Clear.  HEART:  Heart tones normal without extra sounds or murmurs.  ABDOMEN:  Soft but mildly distended.  Good bowel sounds.  Tenderness  only in the areas of recent surgery.  EXTREMITIES:  Showed no clubbing or edema and good pulses.   IMPRESSION:  Dehydration following surgery with hyponatremia and  hypokalemia, as well as difficulty voiding.   PLAN:  A Foley catheter, IV fluids, KUB, and recheck labs in the a.m.      Tasia Catchings, M.D.  Electronically Signed     JW/MEDQ  D:  08/26/2007  T:  08/27/2007  Job:  [id number]

## 2010-08-19 NOTE — Consult Note (Signed)
NAMESUZETTA, Heidi Pratt             ACCOUNT NO.:  [id number]   MEDICAL RECORD NO.:  [id number]          PATIENT TYPE:  INP   LOCATION:  5128                         FACILITY:  [facility]   PHYSICIAN:  Adolph Pollack, M.D.DATE OF BIRTH:  March 08, 1919   DATE OF CONSULTATION:  DATE OF DISCHARGE:                                 CONSULTATION   REASON:  Nausea, vomiting, and abdominal distention.   HISTORY OF PRESENT ILLNESS:  Heidi Pratt is an 77 year old female  found to have a left ovarian mass.  She subsequently underwent a  laparoscopic left salpingo-oophorectomy 3 days ago at [facility].  Immediately postop, she did not feel well, had abdominal  distention, some nausea and vomiting.  She did have a fairly aggressive  bowel prep the day prior to surgery.  She was sent to rehab center, but  continued not to do well.  She has passed gas sporadically.  She is not  been able to hold much down, except for sips of water and has had some  vomiting.  She has a history of intermittent constipation.  She  subsequently was admitted to [facility] by Dr. Sherin Quarry.  Upon  admission, she was noted to have electrolyte abnormalities including a  potassium of 3.1 and a sodium of 127.  Also abdominal x-rays  demonstrated some dilated small-bowel loops with air-fluid levels and  some gas in the colon consistent with a postoperative ileus versus  partial small bowel obstruction.  She states she is not having any pain,  thus she has painless distention.  I had been asked to see her.   PAST MEDICAL HISTORY:  1. Hypertension.  2. Degenerative joint disease.  3. Hypothyroidism.  4. Osteoporosis.  5. Recently diagnosed left ovarian cancer.   PREVIOUS ABDOMINAL OPERATIONS:  1. Appendectomy.  2. Hysterectomy.  3. Postop day 3, laparoscopic left salpingo-oophorectomy.   REVIEW OF SYSTEMS:  CARDIOVASCULAR:  She reports no heart disease or  heart attack.  PULMONARY:  She states she has  had pleurisy in the past.  GI:  She denies peptic ulcer disease, diverticulitis, or hepatitis.  GU:  No kidney stones, but does have a pessary in.   PHYSICAL EXAMINATION:  GENERAL:  An elderly very pleasant female in no  acute distress.  PSYCHIATRIC:  She is awake, alert, and answers questions well.  VITAL SIGNS:  Temperature is 98.6, blood pressure is 161/82, and pulse  85.  NECK:  Supple without masses.  RESPIRATORY:  Breath sounds equal and clear.  Respirations unlabored.  CARDIOVASCULAR:  Regular rate and rhythm.  EXTREMITIES:  No lower extremity edema.  ABDOMEN:  Slightly firm and distended, but is nontender.  There is a  well-healed lower midline scar and right lower quadrant scar.  There is  a small epigastric incision that is flat and nontender.  There are 2  small 5 mm incisions, one on the right and one on the left that are  flat.  There is an incision at the level of the pubic bone that has some  ecchymosis around it and slightly firm consistent with hematoma, which  is nontender.  She has a rare bowel sound heard.   LABORATORY DATA:  White cell count 11100, hemoglobin 13, and  electrolytes as previously mentioned.   IMPRESSION:  Painless abdominal distension with nausea and vomiting  postop with hypokalemia and hyponatremia.  This is highly suspicious for  a postoperative ileus.  Other possibilities included early partial small  bowel obstruction secondary to early adhesion formation or possible port-  site hernia.  However, the port site hernia is not evident clinically.   RECOMMENDATION:  Correct aggressive potassium repletion.  Check her  magnesium level with a.m. labs.  Check x-rays tomorrow.  If she does not  improve with hydration and correction of electrolytes then I will  suggest a CT scan with oral contrast to rule out a port site hernia  causing a partial small bowel obstruction.  I have discussed this with  her son.      Adolph Pollack, M.D.   Electronically Signed     TJR/MEDQ  D:  08/26/2007  T:  08/27/2007  Job:  [id number]   cc:   Tasia Catchings, M.D.

## 2010-08-19 NOTE — Discharge Summary (Signed)
NAMEBECKHAM, BUXBAUM             ACCOUNT NO.:  [id number]   MEDICAL RECORD NO.:  [id number]          PATIENT TYPE:  INP   LOCATION:  5156                         FACILITY:  [facility]   PHYSICIAN:  Gabrielle Dare. Janee Morn, M.D.DATE OF BIRTH:  Aug 28, 1918   DATE OF ADMISSION:  08/26/2007  DATE OF DISCHARGE:  08/30/2007                               DISCHARGE SUMMARY   DISCHARGE DIAGNOSES:  1. Acute trocar site hernia after laparoscopic left oophorectomy.  2. Small bowel obstruction secondary to above.  3. Status post repair of acute trocar site hernia.   HISTORY OF PRESENT ILLNESS:  Ms. Hodgkins is a very pleasant 77-year-  old female who was admitted 3 days status post laparoscopic left  salpingo-oophorectomy at [facility].  She had signs and symptoms  of bowel obstruction   HOSPITAL COURSE:  The patient was admitted and treated medically with  bowel rest, NG tube, and IV fluids.  She continued to have obstructive  symptoms.  A day after admission, CT scan of the abdomen and pelvis was  obtained as there was concern that she had developed an acute trocar  site hernia.  Indeed, the CT scan showed an acute trocar site hernia  with loops of small bowel at her suprapubic trocar site.  She was taken  emergently to the operating room for repair, and the bowel was viable.   Postoperatively, she had some hypokalemia that was replaced.  NG tube  was continued for another day.  Bowel function returned in short order.  By postoperative day #2, NG tube was removed and she began to have bowel  movements, and tolerated gradual advancement of her diet.  She otherwise  remained afebrile and hemodynamically stable and continued to move her  bowels well, mobilized and was discharged home in stable condition on  postoperative day #3.      Gabrielle Dare Janee Morn, M.D.  Electronically Signed     BET/MEDQ  D:  08/30/2007  T:  08/30/2007  Job:  [id number]   cc:   Tasia Catchings, M.D.

## 2010-09-17 ENCOUNTER — Other Ambulatory Visit: Payer: Self-pay | Admitting: Oncology

## 2010-09-17 ENCOUNTER — Encounter (HOSPITAL_BASED_OUTPATIENT_CLINIC_OR_DEPARTMENT_OTHER): Payer: Medicare Other | Admitting: Oncology

## 2010-09-17 DIAGNOSIS — C569 Malignant neoplasm of unspecified ovary: Secondary | ICD-10-CM

## 2010-12-17 ENCOUNTER — Encounter (HOSPITAL_BASED_OUTPATIENT_CLINIC_OR_DEPARTMENT_OTHER): Payer: Medicare Other | Admitting: Oncology

## 2010-12-17 ENCOUNTER — Other Ambulatory Visit: Payer: Self-pay | Admitting: Oncology

## 2010-12-17 DIAGNOSIS — C569 Malignant neoplasm of unspecified ovary: Secondary | ICD-10-CM

## 2010-12-17 LAB — CBC WITH DIFFERENTIAL/PLATELET
Basophils Absolute: 0 10*3/uL (ref 0.0–0.1)
Eosinophils Absolute: 0 10*3/uL (ref 0.0–0.5)
HCT: 33.5 % — ABNORMAL LOW (ref 34.8–46.6)
LYMPH%: 21.6 % (ref 14.0–49.7)
MCHC: 34.3 g/dL (ref 31.5–36.0)
MONO#: 0.6 10*3/uL (ref 0.1–0.9)
NEUT#: 4.4 10*3/uL (ref 1.5–6.5)
NEUT%: 68.2 % (ref 38.4–76.8)
Platelets: 220 10*3/uL (ref 145–400)
WBC: 6.4 10*3/uL (ref 3.9–10.3)

## 2010-12-18 ENCOUNTER — Other Ambulatory Visit: Payer: Self-pay | Admitting: Internal Medicine

## 2010-12-18 ENCOUNTER — Other Ambulatory Visit: Payer: Self-pay | Admitting: Obstetrics and Gynecology

## 2010-12-18 DIAGNOSIS — Z1231 Encounter for screening mammogram for malignant neoplasm of breast: Secondary | ICD-10-CM

## 2010-12-19 LAB — IRON AND TIBC
Iron: 78 ug/dL (ref 42–145)
TIBC: 376 ug/dL (ref 250–470)
UIBC: 298 ug/dL (ref 125–400)

## 2010-12-19 LAB — COMPREHENSIVE METABOLIC PANEL
ALT: 18 U/L (ref 0–35)
AST: 22 U/L (ref 0–37)
Alkaline Phosphatase: 68 U/L (ref 39–117)
CO2: 25 mEq/L (ref 19–32)
Sodium: 135 mEq/L (ref 135–145)
Total Bilirubin: 0.3 mg/dL (ref 0.3–1.2)
Total Protein: 6.4 g/dL (ref 6.0–8.3)

## 2010-12-19 LAB — IMMUNOFIXATION ELECTROPHORESIS: Total Protein, Serum Electrophoresis: 6.4 g/dL (ref 6.0–8.3)

## 2010-12-22 ENCOUNTER — Encounter: Payer: Medicare Other | Admitting: Oncology

## 2010-12-23 ENCOUNTER — Ambulatory Visit (HOSPITAL_COMMUNITY)
Admission: RE | Admit: 2010-12-23 | Discharge: 2010-12-23 | Disposition: A | Discharge status: Home / Self Care | Payer: Medicare Other | Source: Ambulatory Visit | Attending: Oncology | Admitting: Oncology

## 2010-12-23 ENCOUNTER — Other Ambulatory Visit: Payer: Self-pay | Admitting: Oncology

## 2010-12-23 ENCOUNTER — Encounter (HOSPITAL_BASED_OUTPATIENT_CLINIC_OR_DEPARTMENT_OTHER): Payer: Medicare Other | Admitting: Oncology

## 2010-12-23 DIAGNOSIS — C569 Malignant neoplasm of unspecified ovary: Secondary | ICD-10-CM

## 2010-12-23 DIAGNOSIS — D472 Monoclonal gammopathy: Secondary | ICD-10-CM

## 2010-12-23 DIAGNOSIS — M47814 Spondylosis without myelopathy or radiculopathy, thoracic region: Secondary | ICD-10-CM | POA: Insufficient documentation

## 2010-12-23 DIAGNOSIS — Z8543 Personal history of malignant neoplasm of ovary: Secondary | ICD-10-CM

## 2010-12-23 DIAGNOSIS — D649 Anemia, unspecified: Secondary | ICD-10-CM

## 2010-12-23 DIAGNOSIS — M47817 Spondylosis without myelopathy or radiculopathy, lumbosacral region: Secondary | ICD-10-CM | POA: Insufficient documentation

## 2010-12-23 DIAGNOSIS — M47812 Spondylosis without myelopathy or radiculopathy, cervical region: Secondary | ICD-10-CM | POA: Insufficient documentation

## 2010-12-26 ENCOUNTER — Other Ambulatory Visit: Payer: Self-pay | Admitting: Oncology

## 2010-12-26 LAB — KAPPA/LAMBDA LIGHT CHAINS
Kappa:Lambda Ratio: 5.77 — ABNORMAL HIGH (ref 0.26–1.65)
Lambda Free Lght Chn: 0.64 mg/dL (ref 0.57–2.63)

## 2010-12-30 LAB — UIFE/LIGHT CHAINS/TP QN, 24-HR UR
Albumin, U: DETECTED
Alpha 1, Urine: DETECTED — AB
Beta, Urine: DETECTED — AB
Free Lambda Lt Chains,Ur: 0.03 mg/dL (ref 0.02–0.67)
Gamma Globulin, Urine: DETECTED — AB
Volume, Urine: 2950 mL

## 2010-12-30 LAB — CREATININE CLEARANCE, URINE, 24 HOUR
Creatinine, 24H Ur: 746 mg/d (ref 700–1800)
Creatinine, Urine: 25.3 mg/dL
Creatinine: 0.72 mg/dL (ref 0.50–1.10)
Urine Total Volume-CRCL: 2950 mL

## 2010-12-31 LAB — CBC
Hemoglobin: 10.8 — ABNORMAL LOW
Hemoglobin: 10.8 — ABNORMAL LOW
MCHC: 33.8
MCHC: 34.8
MCHC: 35.1
MCV: 92.6
Platelets: 236
Platelets: 245
RBC: 3.34 — ABNORMAL LOW
RDW: 14.9
RDW: 14.9

## 2010-12-31 LAB — COMPREHENSIVE METABOLIC PANEL
ALT: 14
ALT: 17
AST: 19
Albumin: 2.2 — ABNORMAL LOW
Calcium: 7.2 — ABNORMAL LOW
Calcium: 8 — ABNORMAL LOW
GFR calc Af Amer: >60
GFR calc Af Amer: >60
Glucose, Bld: 115 — ABNORMAL HIGH
Sodium: 129 — ABNORMAL LOW
Sodium: 135
Total Protein: 4.9 — ABNORMAL LOW
Total Protein: 5 — ABNORMAL LOW

## 2010-12-31 LAB — BASIC METABOLIC PANEL
CO2: 27
Chloride: 102
Creatinine, Ser: 0.59
GFR calc Af Amer: >60

## 2011-01-01 ENCOUNTER — Ambulatory Visit
Admission: RE | Admit: 2011-01-01 | Discharge: 2011-01-01 | Disposition: A | Discharge status: Home / Self Care | Payer: Medicare Other | Source: Ambulatory Visit | Attending: Obstetrics and Gynecology | Admitting: Obstetrics and Gynecology

## 2011-01-01 DIAGNOSIS — Z1231 Encounter for screening mammogram for malignant neoplasm of breast: Secondary | ICD-10-CM

## 2011-01-29 ENCOUNTER — Encounter (HOSPITAL_BASED_OUTPATIENT_CLINIC_OR_DEPARTMENT_OTHER): Payer: Medicare Other | Admitting: Oncology

## 2011-01-29 ENCOUNTER — Other Ambulatory Visit: Payer: Self-pay | Admitting: Oncology

## 2011-01-29 ENCOUNTER — Other Ambulatory Visit (HOSPITAL_COMMUNITY)
Admission: RE | Admit: 2011-01-29 | Discharge: 2011-01-29 | Disposition: A | Discharge status: Home / Self Care | Payer: Medicare Other | Source: Ambulatory Visit | Attending: Oncology | Admitting: Oncology

## 2011-01-29 DIAGNOSIS — D472 Monoclonal gammopathy: Secondary | ICD-10-CM

## 2011-01-29 LAB — DIFFERENTIAL
Basophils Relative: 0 % (ref 0–1)
Eosinophils Absolute: 0.1 10*3/uL (ref 0.0–0.7)
Monocytes Absolute: 0.6 10*3/uL (ref 0.1–1.0)
Monocytes Relative: 11 % (ref 3–12)
Neutrophils Relative %: 58 % (ref 43–77)

## 2011-01-29 LAB — CBC
MCH: 32.3 pg (ref 26.0–34.0)
MCHC: 34.8 g/dL (ref 30.0–36.0)
Platelets: 235 10*3/uL (ref 150–400)

## 2011-02-02 ENCOUNTER — Encounter: Payer: Medicare Other | Admitting: Oncology

## 2011-02-02 ENCOUNTER — Telehealth: Payer: Self-pay | Admitting: Oncology

## 2011-02-02 NOTE — Telephone Encounter (Signed)
gv pt appt schedule for nov.  °

## 2011-02-23 ENCOUNTER — Telehealth: Payer: Self-pay | Admitting: Oncology

## 2011-02-23 ENCOUNTER — Other Ambulatory Visit: Payer: Self-pay | Admitting: Oncology

## 2011-02-23 ENCOUNTER — Ambulatory Visit (HOSPITAL_BASED_OUTPATIENT_CLINIC_OR_DEPARTMENT_OTHER): Payer: Medicare Other | Admitting: Oncology

## 2011-02-23 ENCOUNTER — Encounter: Payer: Self-pay | Admitting: Oncology

## 2011-02-23 ENCOUNTER — Other Ambulatory Visit (HOSPITAL_BASED_OUTPATIENT_CLINIC_OR_DEPARTMENT_OTHER): Payer: Medicare Other | Admitting: Lab

## 2011-02-23 DIAGNOSIS — C9000 Multiple myeloma not having achieved remission: Secondary | ICD-10-CM

## 2011-02-23 DIAGNOSIS — C569 Malignant neoplasm of unspecified ovary: Secondary | ICD-10-CM

## 2011-02-23 DIAGNOSIS — A0472 Enterocolitis due to Clostridium difficile, not specified as recurrent: Secondary | ICD-10-CM | POA: Insufficient documentation

## 2011-02-23 DIAGNOSIS — J189 Pneumonia, unspecified organism: Secondary | ICD-10-CM

## 2011-02-23 DIAGNOSIS — D72822 Plasmacytosis: Secondary | ICD-10-CM

## 2011-02-23 HISTORY — DX: Malignant neoplasm of unspecified ovary: C56.9

## 2011-02-23 HISTORY — DX: Enterocolitis due to Clostridium difficile, not specified as recurrent: A04.72

## 2011-02-23 HISTORY — DX: Pneumonia, unspecified organism: J18.9

## 2011-02-23 LAB — COMPREHENSIVE METABOLIC PANEL
ALT: 21 U/L (ref 0–35)
CO2: 26 mEq/L (ref 19–32)
Chloride: 99 mEq/L (ref 96–112)
Sodium: 134 mEq/L — ABNORMAL LOW (ref 135–145)
Total Bilirubin: 0.3 mg/dL (ref 0.3–1.2)
Total Protein: 6.3 g/dL (ref 6.0–8.3)

## 2011-02-23 LAB — CBC WITH DIFFERENTIAL/PLATELET
BASO%: 0.2 % (ref 0.0–2.0)
LYMPH%: 17.1 % (ref 14.0–49.7)
MCHC: 34.2 g/dL (ref 31.5–36.0)
MONO#: 0.7 10*3/uL (ref 0.1–0.9)
RBC: 3.53 10*6/uL — ABNORMAL LOW (ref 3.70–5.45)
WBC: 7.2 10*3/uL (ref 3.9–10.3)
lymph#: 1.2 10*3/uL (ref 0.9–3.3)

## 2011-02-23 LAB — LACTATE DEHYDROGENASE: LDH: 235 U/L (ref 94–250)

## 2011-02-23 NOTE — Progress Notes (Signed)
CC: Thora Lance, M.D.  Thayer Ohm, MD   Extended office visit for this delightful 77 year old woman with her physician son to discuss results of recent bone marrow biopsy and ancillary studies.   I have followed Ms. Laske since May 2009 when she was diagnosed with an early stage ovarian cancer, treated with surgery alone.  She has had a number of medical problems over the last 3 years with hospitalization for pneumonia complicated by C difficile colitis.  Whenever she gets an infection, her CA125 goes up, but has always come down to the normal range when she is otherwise stable and there has been no evidence for recurrent ovarian cancer.   She had an unexplained fall in her hemoglobin back in March which dropped from her baseline of 11.6 to 9.5.  MCV 90.  A number of laboratory studies were done since that time which showed that she in fact has a monoclonal IgA kappa gammopathy.  On 12/17/2010 IgA level 609 (68-380) with suppression of IgG to 473 and IgM to 14 mg%.  Kappa free light chains elevated 3.69 (0.33-1.94) with a ratio of 5.77 (0.26-1.65).  Beta-2 microglobulin borderline elevated at 1.78 (1.01-1.73).  A 24-urine collection done on 12/26/2010 showed only 62 mg of total protein, but there was monoclonal free kappa light chain on IFE.   We had some scheduling problems with the bone marrow biopsy when she misunderstood the day to come and we rescheduled it to last Thursday.  I reviewed the results with the pathologist this morning and unfortunately it is diagnostic for multiple myeloma with 31% plasma cells, some of which are atypical and consistent with myeloma rather than a reactive process.  I did a skeletal bone survey on 12/23/2010 which did not show any lytic lesions.  A chemistry profile done on 9/12 showed normal renal function, BUN 20, creatinine 0.7, normal albumin 4.1, normal serum total protein 6.4, calcium 8.9.  IMPRESSION:  IgA kappa multiple myeloma.  Based on  international prognostic index, looking at just beta-2 microglobulin and albumin, she is in a good risk subgroup.  Based on more traditional staging, she has no end-organ damage.  This puts her at an early stage.  However, she has a significant plasmacytosis in the bone marrow.  I feel treatment is indicated at this point to prevent progression to end-organ damage.   I discussed a treatment program with her and gave her written notes.  In view of her age, I want to start out with a modified program with Alkeran at 5 mg/sq m total dose 8 mg daily x4 days each month, with prednisone 20 mg/sq m total dose 40 mg 7 days each month.  If this is tolerated, I can slowly escalate doses of both drugs.  I would also consider adding Revlimid, initially at 5 mg daily and then escalating to 10 mg as tolerated.  Studies out of [place] show an approximate doubling of the survival from 2 years with standard treatment to 4 years with addition of the Revlimid to the Alkeran and prednisone backbone.   We reviewed potential side effects today.  I am giving her Zofran oral dissolving tablet 4 mg to take 30 minutes before the prednisone and Alkeran and then q.8 hours p.r.n.  Alkeran 2 mg tablets take 4 daily x4 days and prednisone 20 mg tablets take 2 tablets x7 days to begin treatment on Monday, November 5th.  I will see her 2 weeks into the treatment program to assess tolerance and  make any further adjustments necessary.   She was very tearful over the prospect of having to take any kind of chemotherapy treatments.  However, both myself and her son tried to reassure her that if we can delay progression of her myeloma, the quality of her life should be better.    ______________________________ Levert Feinstein, M.D., F.A.C.P. JMG/MEDQ  D:  02/02/2011  T:  02/03/2011  Job:  [id number]  02/23/11: Mrs. Medellin recently started on oral Alkeran and prednisone in an attenuated doses for newly diagnosed IgA kappa multiple myeloma.  She tolerated treatment extremely well with no acute toxicities. I started her out of 5 mg per meter squared on the Alkeran total dose 8 mg daily x4 days and prednisone 40 mg daily x7 days. She has confidence that she will be able to stay on this program but 1 me today that this is she gets any side effects she will stop. She is accompanied again today by her physician son.  As noted above, she had no nausea vomiting or gastritis. No insomnia. Review of systems otherwise unremarkable.  On exam head and neck normal pharynx no erythema exudate or ulcer lungs clear and resonant to percussion regular cardiac rhythm no murmur no adenopathy abdomen soft nontender extremities no edema no calf tenderness neurologic motor strength 5 over 5 reflexes 1+ symmetric.  Lab is stable. No fall in her white count or platelets in fact her white count is higher compared with the pretreatment values.  Impression #1. IgA multiple myeloma minimal disease with known bone involvement. Plan continue Alkeran and prednisone at current doses. She tolerated cycle 2 well which I expect that she will I will attempt to slowly escalate doses going into cycle 3.  #2. Stage I ovarian cancer. Treated with surgery alone. No gross evidence for recurrent disease at this time now out 3-1/2 years from diagnosis in May 2009

## 2011-02-23 NOTE — Telephone Encounter (Signed)
gv pt appt schedule for dec.  °

## 2011-02-25 ENCOUNTER — Telehealth: Payer: Self-pay | Admitting: *Deleted

## 2011-02-25 NOTE — Telephone Encounter (Signed)
02/23/11 labs faxed to Dr. Kirby Funk per Dr. Cyndie Chime electronically.

## 2011-03-19 ENCOUNTER — Other Ambulatory Visit (HOSPITAL_BASED_OUTPATIENT_CLINIC_OR_DEPARTMENT_OTHER): Payer: Medicare Other | Admitting: Lab

## 2011-03-19 ENCOUNTER — Other Ambulatory Visit: Payer: Self-pay | Admitting: Oncology

## 2011-03-19 DIAGNOSIS — C9000 Multiple myeloma not having achieved remission: Secondary | ICD-10-CM

## 2011-03-19 DIAGNOSIS — D472 Monoclonal gammopathy: Secondary | ICD-10-CM

## 2011-03-19 LAB — CBC WITH DIFFERENTIAL/PLATELET
BASO%: 0.4 % (ref 0.0–2.0)
EOS%: 1 % (ref 0.0–7.0)
HCT: 32.3 % — ABNORMAL LOW (ref 34.8–46.6)
LYMPH%: 23.9 % (ref 14.0–49.7)
MCH: 31.5 pg (ref 25.1–34.0)
MCHC: 33.7 g/dL (ref 31.5–36.0)
MCV: 93.4 fL (ref 79.5–101.0)
MONO%: 9 % (ref 0.0–14.0)
NEUT%: 65.7 % (ref 38.4–76.8)
lymph#: 1.2 10*3/uL (ref 0.9–3.3)

## 2011-03-19 LAB — COMPREHENSIVE METABOLIC PANEL
AST: 19 U/L (ref 0–37)
Alkaline Phosphatase: 63 U/L (ref 39–117)
BUN: 26 mg/dL — ABNORMAL HIGH (ref 6–23)
Creatinine, Ser: 0.9 mg/dL (ref 0.50–1.10)
Potassium: 4.1 mEq/L (ref 3.5–5.3)
Total Bilirubin: 0.1 mg/dL — ABNORMAL LOW (ref 0.3–1.2)

## 2011-03-23 LAB — KAPPA/LAMBDA LIGHT CHAINS
Kappa:Lambda Ratio: 6.52 — ABNORMAL HIGH (ref 0.26–1.65)
Lambda Free Lght Chn: 0.52 mg/dL — ABNORMAL LOW (ref 0.57–2.63)

## 2011-03-23 LAB — IGA: IgA: 412 mg/dL — ABNORMAL HIGH (ref 69–380)

## 2011-03-24 ENCOUNTER — Telehealth: Payer: Self-pay | Admitting: Oncology

## 2011-03-24 ENCOUNTER — Ambulatory Visit (HOSPITAL_BASED_OUTPATIENT_CLINIC_OR_DEPARTMENT_OTHER): Payer: Medicare Other | Admitting: Oncology

## 2011-03-24 VITALS — BP 163/74 | HR 62 | Temp 97.0°F | Ht 63.0 in | Wt 127.4 lb

## 2011-03-24 DIAGNOSIS — C569 Malignant neoplasm of unspecified ovary: Secondary | ICD-10-CM

## 2011-03-24 DIAGNOSIS — M19049 Primary osteoarthritis, unspecified hand: Secondary | ICD-10-CM

## 2011-03-24 DIAGNOSIS — E039 Hypothyroidism, unspecified: Secondary | ICD-10-CM

## 2011-03-24 DIAGNOSIS — Z8543 Personal history of malignant neoplasm of ovary: Secondary | ICD-10-CM

## 2011-03-24 DIAGNOSIS — C9000 Multiple myeloma not having achieved remission: Secondary | ICD-10-CM

## 2011-03-24 NOTE — Progress Notes (Signed)
Patient ID: Heidi Pratt, female   DOB: 08/15/1918, 77 y.o.   MRN: [id number]

## 2011-03-24 NOTE — Progress Notes (Signed)
Followup visit for this delightful 77 year old woman initially followed here for stage I ovarian cancer. She never required any chemotherapy. Every time she gets an infection her CA 125 goes up but has always come back down to normal again. Recent evaluation for a normochromic anemia unexpectedly turned up IgA multiple myeloma. In view of her age and desire to be on the least toxic treatment possible, she was started on a combination of oral Alkeran 5 mg per meter squared 4 days each month along with 7 days of prednisone each month 60 mg daily. She is tolerated this regimen extremely well with no toxicities. She started treatment on November 5 and has had 2 cycles so far. Although it is a little early to assess response, I repeated lab work in anticipation of today's visit and was pleasantly surprised to see that she is already showing signs of response with a fall in her IgA from 609 mg percent on 9/12 to current value of 412 mg percent on 12/13. There has been no significant change in her kappa free light chains which are only borderline elevated to begin with with baseline value 3.69 mg percent and current value 3.39%. (Normal range up to 1.94 mg percent). New  Review of systems: Appetite is good. She actually feels better on Motrin for her arthritis and she does on the week of prednisone. She denies any dyspnea no chest pain no palpitations no abdominal pain no change in bowel habit no abdominal distention. No edema hematologic profile has not changed on the low-dose Alkeran with a normal white count and stable hemoglobin.  Physical exam: Head and neck: Normal Lung exam: Clear to auscultation resonant to percussion. Cardiac exam: Regular cardiac rhythm no murmur Abdominal exam: Chronic moderate distention and question fluid wave unchanged from prior exams. No mass no organomegaly. Neurologic exam: No focal deficit. Extremities: No edema. Advanced osteoarthritic changes of the  hands.  Impression:  #1. IgA multiple myeloma. Early response to low-dose oral Alkeran and prednisone. Plan continue current regimen at current doses. She is not interested in attempting a dose escalation at this time.  #2. Stage I ovarian cancer. Status post unilateral oophorectomy May 2009. Clinically stable no evidence for recurrence. Most recent CA.125 25.8 units on 12/17/10  #3. History of severe C. difficile colitis in the past-resolved.  #4. Degenerative arthritis.  #5. Hypothyroid on replacement.  #6. Essential hypertension.  #7. History of pneumonia October 2010 colitis during that admission.  #8. History of a positive PPD. Granulomas on CT scan of the chest. Stable.    copy dictation:  Dr. Kirby Funk Dr. Lyman Bishop [place]

## 2011-03-24 NOTE — Telephone Encounter (Signed)
gve the pt her jan-march2013 appt calendar

## 2011-04-28 ENCOUNTER — Other Ambulatory Visit: Payer: Medicare Other | Admitting: Lab

## 2011-04-29 ENCOUNTER — Other Ambulatory Visit (HOSPITAL_BASED_OUTPATIENT_CLINIC_OR_DEPARTMENT_OTHER): Payer: Medicare Other | Admitting: Lab

## 2011-04-29 DIAGNOSIS — C9000 Multiple myeloma not having achieved remission: Secondary | ICD-10-CM

## 2011-04-29 DIAGNOSIS — C569 Malignant neoplasm of unspecified ovary: Secondary | ICD-10-CM

## 2011-04-29 LAB — CBC WITH DIFFERENTIAL/PLATELET
BASO%: 0.3 % (ref 0.0–2.0)
EOS%: 1 % (ref 0.0–7.0)
HGB: 10.6 g/dL — ABNORMAL LOW (ref 11.6–15.9)
MCH: 33.8 pg (ref 25.1–34.0)
MCHC: 34.5 g/dL (ref 31.5–36.0)
RBC: 3.14 10*6/uL — ABNORMAL LOW (ref 3.70–5.45)
RDW: 17.5 % — ABNORMAL HIGH (ref 11.2–14.5)
lymph#: 0.9 10*3/uL (ref 0.9–3.3)

## 2011-05-01 LAB — KAPPA/LAMBDA LIGHT CHAINS
Kappa free light chain: 2.97 mg/dL — ABNORMAL HIGH (ref 0.33–1.94)
Lambda Free Lght Chn: 0.23 mg/dL — ABNORMAL LOW (ref 0.57–2.63)

## 2011-06-02 ENCOUNTER — Ambulatory Visit (HOSPITAL_BASED_OUTPATIENT_CLINIC_OR_DEPARTMENT_OTHER): Payer: Medicare Other | Admitting: Oncology

## 2011-06-02 ENCOUNTER — Encounter: Payer: Self-pay | Admitting: Oncology

## 2011-06-02 ENCOUNTER — Telehealth: Payer: Self-pay | Admitting: Oncology

## 2011-06-02 ENCOUNTER — Other Ambulatory Visit: Payer: Medicare Other | Admitting: Lab

## 2011-06-02 VITALS — BP 171/79 | HR 72 | Temp 97.1°F | Wt 128.4 lb

## 2011-06-02 DIAGNOSIS — M1990 Unspecified osteoarthritis, unspecified site: Secondary | ICD-10-CM

## 2011-06-02 DIAGNOSIS — J8410 Pulmonary fibrosis, unspecified: Secondary | ICD-10-CM

## 2011-06-02 DIAGNOSIS — C9000 Multiple myeloma not having achieved remission: Secondary | ICD-10-CM

## 2011-06-02 DIAGNOSIS — E039 Hypothyroidism, unspecified: Secondary | ICD-10-CM

## 2011-06-02 DIAGNOSIS — Z9289 Personal history of other medical treatment: Secondary | ICD-10-CM

## 2011-06-02 DIAGNOSIS — C569 Malignant neoplasm of unspecified ovary: Secondary | ICD-10-CM

## 2011-06-02 DIAGNOSIS — I10 Essential (primary) hypertension: Secondary | ICD-10-CM

## 2011-06-02 DIAGNOSIS — J984 Other disorders of lung: Secondary | ICD-10-CM

## 2011-06-02 HISTORY — DX: Hypothyroidism, unspecified: E03.9

## 2011-06-02 HISTORY — DX: Pulmonary fibrosis, unspecified: J84.10

## 2011-06-02 HISTORY — DX: Unspecified osteoarthritis, unspecified site: M19.90

## 2011-06-02 HISTORY — DX: Essential (primary) hypertension: I10

## 2011-06-02 HISTORY — DX: Personal history of other medical treatment: Z92.89

## 2011-06-02 HISTORY — DX: Other disorders of lung: J98.4

## 2011-06-02 LAB — CBC WITH DIFFERENTIAL/PLATELET
BASO%: 0.4 % (ref 0.0–2.0)
Eosinophils Absolute: 0.1 10*3/uL (ref 0.0–0.5)
LYMPH%: 18.1 % (ref 14.0–49.7)
MCHC: 33.5 g/dL (ref 31.5–36.0)
MCV: 99.5 fL (ref 79.5–101.0)
MONO%: 15.5 % — ABNORMAL HIGH (ref 0.0–14.0)
NEUT%: 64.4 % (ref 38.4–76.8)
Platelets: 194 10*3/uL (ref 145–400)
RBC: 3.14 10*6/uL — ABNORMAL LOW (ref 3.70–5.45)

## 2011-06-02 NOTE — Progress Notes (Signed)
Hematology and Oncology Follow Up Visit  PAETON LATOUCHE [id number] [birth year] 78 y.o. 06/02/2011 7:30 PM   Principle Diagnosis: Encounter Diagnoses  Name Primary?  . Multiple myeloma without mention of remission Yes  . Ovarian epithelial cancer      Interim History:   Followup visit for this soon to be 78 year old woman recently diagnosed with IgA kappa multiple myeloma when she was evaluated for a normochromic anemia in September of 2012. Bone marrow biopsy was done in November and showed 31% plasma cells. There were no lytic lesions on bone survey. No significant proteinuria but there was monoclonal free kappa light chain on immunofixation electrophoresis. Serum free light chains with a slight elevation of kappa 3.69 mg percent normal up to 1.94; borderline elevation of data to microglobulin 1.78 normal up to 1.73, an initial serum IgA immunoglobulin 609 mg percent (68-380) with concomitant suppression of IgG and IgM. After a lengthy discussion she agreed to begin treatment with Alkeran and prednisone. In view of her age she was started on 5 mg per meter squared of Alkeran total dose 8 mg daily 4 days each month and prednisone 40 mg daily 7 days each month. Overall she is tolerated treatment well. She really does not like the feeling that she gets when she is on the steroids. No GI complaints but just a sense that she is not herself. She has had no interim medical problems. She is now had 4 cycles of treatment beginning on 02/09/2011. I initially thought  that we were seeing an early response with fall in the IgA from 609 down to 412 mg percent by 03/19/2011. However subsequent value of IgA and done January 23 was 445 mg percent and considering that she only started her first cycle of treatment in November it seems likely that we have not really seen a significant response. This being said, her total IgA is only slightly elevated above the normal range which goes up to 380, her hemoglobin has  been stable, and her kappa free light chains have been stable with initial value 3.69 mg percent in September and current value of 2.97 as of January 23.  Medications: reviewed  Allergies: No Known Allergies  Review of Systems: Constitutional:   No constitutional symptoms Respiratory: No cough or dyspnea Cardiovascular:  No chest pain or palpitations Gastrointestinal: No change in bowel habit Genito-Urinary: No urinary tract symptoms Musculoskeletal: No bone pain Neurologic: No headache or change in vision Skin: No rash or ecchymosis Remaining ROS negative.  Physical Exam: Blood pressure 171/79, pulse 72, temperature 97.1 F (36.2 C), temperature source Oral, weight 128 lb 6.4 oz (58.242 kg). Wt Readings from Last 3 Encounters:  06/02/11 128 lb 6.4 oz (58.242 kg)  03/24/11 127 lb 6.4 oz (57.788 kg)  02/23/11 127 lb 8 oz (57.834 kg)     General appearance: Pleasant thin Caucasian woman HENNT: Normal Lymph nodes: No adenopathy Breasts: Lungs: Clear to auscultation resonant to percussion Heart: Regular cardiac rhythm no murmur Abdomen: Soft nontender no mass no organomegaly Extremities: Osteoarthritic changes of the fingers no peripheral edema Vascular: No cyanosis Neurologic: No focal deficit Skin: No rash or ecchymosis  Lab Results: Lab Results  Component Value Date   WBC 4.9 06/02/2011   HGB 10.5* 06/02/2011   HCT 31.3* 06/02/2011   MCV 99.5 06/02/2011   PLT 194 06/02/2011     Chemistry      Component Value Date/Time   NA 132* 03/19/2011 1534   NA 132* 03/19/2011 1534  K 4.1 03/19/2011 1534   K 4.1 03/19/2011 1534   CL 96 03/19/2011 1534   CL 96 03/19/2011 1534   CO2 27 03/19/2011 1534   CO2 27 03/19/2011 1534   BUN 26* 03/19/2011 1534   BUN 26* 03/19/2011 1534   CREATININE 0.90 03/19/2011 1534   CREATININE 0.90 03/19/2011 1534   CREATININE 0.72 12/26/2010 1039   CREATININE 0.72 12/26/2010 1039   CREATININE 0.72 12/26/2010 1039      Component Value Date/Time    CALCIUM 9.3 03/19/2011 1534   CALCIUM 9.3 03/19/2011 1534   ALKPHOS 63 03/19/2011 1534   ALKPHOS 63 03/19/2011 1534   AST 19 03/19/2011 1534   AST 19 03/19/2011 1534   ALT 18 03/19/2011 1534   ALT 18 03/19/2011 1534   BILITOT 0.1* 03/19/2011 1534   BILITOT 0.1* 03/19/2011 1534       Impression and Plan: #1. IgA multiple myeloma Modest response versus random fluctuation in her IgA level She has agreed to let me slightly increased the Alkeran dose by 1 pill a day up to a total of 5 pills or 10 mg daily x4 days each month beginning with her next cycle to start on March 4. I told her she could decrease the prednisone down to just 5 days each cycle. #2. Stage I ovarian cancer. Status post unilateral oophorectomy May 2009.  Clinically stable no evidence for recurrence. Most recent CA.125  was up slightly over normal range at 34.4 on January 23. Given wide fluctuations in this test in the past I am not concerned and we'll just continue to monitor it. #3. History of severe C. difficile colitis in the past-resolved.  #4. Degenerative arthritis.  #5. Hypothyroid on replacement.  #6. Essential hypertension.  #7. History of pneumonia October 2010 colitis during that admission.  #8. History of a positive PPD. Granulomas on CT scan of the chest. Stable.   copy dictation: Dr. Kirby Funk Dr. Thayer Ohm    Levert Feinstein, MD 2/26/20137:30 PM

## 2011-06-02 NOTE — Telephone Encounter (Signed)
appts made and printed for pt aom °

## 2011-06-03 ENCOUNTER — Other Ambulatory Visit: Payer: Self-pay | Admitting: *Deleted

## 2011-06-03 DIAGNOSIS — C9000 Multiple myeloma not having achieved remission: Secondary | ICD-10-CM

## 2011-06-03 LAB — IGA: IgA: 536 mg/dL — ABNORMAL HIGH (ref 69–380)

## 2011-06-03 MED ORDER — MELPHALAN 2 MG PO TABS: 10.0000 mg | ORAL_TABLET | Freq: Every day | ORAL | Status: AC

## 2011-06-29 ENCOUNTER — Other Ambulatory Visit (HOSPITAL_BASED_OUTPATIENT_CLINIC_OR_DEPARTMENT_OTHER): Payer: Medicare Other | Admitting: Lab

## 2011-06-29 DIAGNOSIS — C9000 Multiple myeloma not having achieved remission: Secondary | ICD-10-CM

## 2011-06-29 LAB — CBC WITH DIFFERENTIAL/PLATELET
Basophils Absolute: 0 10*3/uL (ref 0.0–0.1)
EOS%: 1.1 % (ref 0.0–7.0)
Eosinophils Absolute: 0 10*3/uL (ref 0.0–0.5)
HGB: 10.3 g/dL — ABNORMAL LOW (ref 11.6–15.9)
LYMPH%: 26.2 % (ref 14.0–49.7)
MCH: 33 pg (ref 25.1–34.0)
MCV: 98.4 fL (ref 79.5–101.0)
MONO%: 19 % — ABNORMAL HIGH (ref 0.0–14.0)
NEUT#: 1.5 10*3/uL (ref 1.5–6.5)
Platelets: 155 10*3/uL (ref 145–400)
RDW: 17.5 % — ABNORMAL HIGH (ref 11.2–14.5)

## 2011-06-30 LAB — COMPREHENSIVE METABOLIC PANEL
ALT: 13 U/L (ref 0–35)
Albumin: 3.9 g/dL (ref 3.5–5.2)
CO2: 26 mEq/L (ref 19–32)
Calcium: 9 mg/dL (ref 8.4–10.5)
Chloride: 104 mEq/L (ref 96–112)
Glucose, Bld: 88 mg/dL (ref 70–99)
Potassium: 4.3 mEq/L (ref 3.5–5.3)
Sodium: 137 mEq/L (ref 135–145)
Total Protein: 6 g/dL (ref 6.0–8.3)

## 2011-06-30 LAB — KAPPA/LAMBDA LIGHT CHAINS: Kappa:Lambda Ratio: 13.52 — ABNORMAL HIGH (ref 0.26–1.65)

## 2011-06-30 LAB — IGA: IgA: 468 mg/dL — ABNORMAL HIGH (ref 69–380)

## 2011-07-01 ENCOUNTER — Other Ambulatory Visit: Payer: Medicare Other | Admitting: Lab

## 2011-07-16 ENCOUNTER — Other Ambulatory Visit: Payer: Self-pay | Admitting: *Deleted

## 2011-07-16 DIAGNOSIS — D472 Monoclonal gammopathy: Secondary | ICD-10-CM

## 2011-07-16 MED ORDER — POLYSACCHARIDE IRON 150 MG PO CAPS: 150.0000 mg | ORAL_CAPSULE | Freq: Two times a day (BID) | ORAL | Status: DC

## 2011-07-27 ENCOUNTER — Other Ambulatory Visit (HOSPITAL_BASED_OUTPATIENT_CLINIC_OR_DEPARTMENT_OTHER): Payer: Medicare Other | Admitting: Lab

## 2011-07-27 DIAGNOSIS — C9000 Multiple myeloma not having achieved remission: Secondary | ICD-10-CM

## 2011-07-27 DIAGNOSIS — C569 Malignant neoplasm of unspecified ovary: Secondary | ICD-10-CM

## 2011-07-27 LAB — CBC WITH DIFFERENTIAL/PLATELET
BASO%: 0.5 % (ref 0.0–2.0)
Eosinophils Absolute: 0 10*3/uL (ref 0.0–0.5)
LYMPH%: 18 % (ref 14.0–49.7)
MCHC: 33.7 g/dL (ref 31.5–36.0)
MONO#: 0.6 10*3/uL (ref 0.1–0.9)
NEUT#: 1.5 10*3/uL (ref 1.5–6.5)
Platelets: 171 10*3/uL (ref 145–400)
RBC: 2.87 10*6/uL — ABNORMAL LOW (ref 3.70–5.45)
RDW: 17.7 % — ABNORMAL HIGH (ref 11.2–14.5)
WBC: 2.5 10*3/uL — ABNORMAL LOW (ref 3.9–10.3)
nRBC: 1 % — ABNORMAL HIGH (ref 0–0)

## 2011-07-29 LAB — IMMUNOFIXATION ELECTROPHORESIS
IgA: 444 mg/dL — ABNORMAL HIGH (ref 69–380)
Total Protein, Serum Electrophoresis: 5.7 g/dL — ABNORMAL LOW (ref 6.0–8.3)

## 2011-07-29 LAB — COMPREHENSIVE METABOLIC PANEL
Albumin: 4 g/dL (ref 3.5–5.2)
Alkaline Phosphatase: 63 U/L (ref 39–117)
Calcium: 8.8 mg/dL (ref 8.4–10.5)
Chloride: 102 mEq/L (ref 96–112)
Glucose, Bld: 74 mg/dL (ref 70–99)
Potassium: 4.3 mEq/L (ref 3.5–5.3)
Sodium: 136 mEq/L (ref 135–145)
Total Protein: 5.7 g/dL — ABNORMAL LOW (ref 6.0–8.3)

## 2011-07-29 LAB — KAPPA/LAMBDA LIGHT CHAINS
Kappa:Lambda Ratio: 12.23 — ABNORMAL HIGH (ref 0.26–1.65)
Lambda Free Lght Chn: 0.22 mg/dL — ABNORMAL LOW (ref 0.57–2.63)

## 2011-08-04 ENCOUNTER — Telehealth: Payer: Self-pay | Admitting: Oncology

## 2011-08-04 ENCOUNTER — Ambulatory Visit (HOSPITAL_BASED_OUTPATIENT_CLINIC_OR_DEPARTMENT_OTHER): Payer: Medicare Other | Admitting: Oncology

## 2011-08-04 VITALS — BP 149/69 | HR 86 | Temp 97.6°F | Ht 63.0 in | Wt 128.9 lb

## 2011-08-04 DIAGNOSIS — C562 Malignant neoplasm of left ovary: Secondary | ICD-10-CM

## 2011-08-04 DIAGNOSIS — C9000 Multiple myeloma not having achieved remission: Secondary | ICD-10-CM

## 2011-08-04 DIAGNOSIS — C569 Malignant neoplasm of unspecified ovary: Secondary | ICD-10-CM

## 2011-08-04 DIAGNOSIS — D649 Anemia, unspecified: Secondary | ICD-10-CM

## 2011-08-04 NOTE — Telephone Encounter (Signed)
appts made and printed for pt aom °

## 2011-08-05 NOTE — Progress Notes (Signed)
Hematology and Oncology Follow Up Visit  Heidi Pratt [id number] 1918/12/25 78 y.o. 08/05/2011 7:27 AM   Principle Diagnosis: Encounter Diagnoses  Name Primary?  . Multiple myeloma, without mention of having achieved remission   . Ovarian ca, left Yes     Interim History:   Heidi Pratt will be 78 years old next week! She has been on a trial of oral Alkeran and prednisone for the last 6 months beginning on 02/09/2011 when further evaluation of a normochromic anemia revealed findings consistent with IgA kappa multiple myeloma. Bone marrow biopsy done in November 2012 showed 31% plasma cells. Please see previous notes for full details. I reviewed laboratory data with the patient and her physician son. We have really not seen a major impact of treatment on her paraprotein levels. Baseline IgA level was only modestly above  normal range with the highest value recorded at 609 mg percent on 12/17/2010 with upper normal at 380. Second value recorded 03/19/2011 was 412 and she just started treatment so I doubt that this represented a significant treatment effect. Levels have fluctuated with a subsequent high value of 536 on February 26 but overall have remained in the 4-500 range with most current value of 440 on 07/27/2011. Serum kappa free light chains have also not changed very much. Peak value prior to treatment on 12/23/2010 3.69 mg percent normal up to 1.94. Minor fluctuations on subsequent values with current value 2.69% as of April 22. I escalated the Alkeran dose after the fourth cycle and she has had 2 cycles with the increased dose. We are seeing a treatment effect since her white blood count has fallen to 2500 and hemoglobin down from baseline 11 g to current value of 10.1 g. Platelet count was normal and has remained so. Although we have not seen a dramatic change in her laboratory profile, clinically she has remained stable, her hematologic profile also stable, she has not developed any  signs of progressive disease and she has tolerated the program well. She has no new complaints today. No interim fever or infection. She denies any abdominal swelling or pain. Of note her CA 125 the tumor marker which am following in view of history of stage I ovarian cancer, has been slowly rising. There has been no clinical or radiographic evidence of recurrent ovarian cancer and I believe that this tumor marker is up nonspecifically due to the activity of her myeloma.   Medications: reviewed  Allergies:  Allergies  Allergen Reactions  . Crab (Shellfish Allergy) Nausea And Vomiting    Review of Systems: Constitutional:   No constitutional symptoms Respiratory: No cough or dyspnea Cardiovascular: No chest pain or palpitations   Gastrointestinal: No abdominal pain or change in bowel habit Genito-Urinary: No urinary tract symptoms Musculoskeletal: Chronic polyarthralgia due to arthritis Neurologic: No headache or change in vision Skin: No rash Remaining ROS negative.  Physical Exam: Blood pressure 149/69, pulse 86, temperature 97.6 F (36.4 C), temperature source Oral, height 5\' 3"  (1.6 m), weight 128 lb 14.4 oz (58.469 kg). Wt Readings from Last 3 Encounters:  08/04/11 128 lb 14.4 oz (58.469 kg)  06/02/11 128 lb 6.4 oz (58.242 kg)  03/24/11 127 lb 6.4 oz (57.788 kg)     General appearance: Well-nourished Caucasian woman HENNT: Pharynx no erythema or exudate Lymph nodes: No adenopathy Breasts: Not examined Lungs: Clear to auscultation resonant to percussion Heart: Regular rhythm no murmur Abdomen: Soft moderately distended nontender no mass no organomegaly Extremities: No edema no calf tenderness,  osteoarthritic changes of her fingers Vascular: No cyanosis Neurologic: No focal deficit Skin: No rash or ecchymosis  Lab Results: Lab Results  Component Value Date   WBC 2.5* 07/27/2011   HGB 10.1* 07/27/2011   HCT 29.9* 07/27/2011   MCV 104.0* 07/27/2011   PLT 171 07/27/2011       Chemistry      Component Value Date/Time   NA 136 07/27/2011 1058   K 4.3 07/27/2011 1058   CL 102 07/27/2011 1058   CO2 25 07/27/2011 1058   BUN 19 07/27/2011 1058   CREATININE 0.69 07/27/2011 1058   CREATININE 0.72 12/26/2010 1039   CREATININE 0.72 12/26/2010 1039   CREATININE 0.72 12/26/2010 1039      Component Value Date/Time   CALCIUM 8.8 07/27/2011 1058   ALKPHOS 63 07/27/2011 1058   AST 19 07/27/2011 1058   ALT 15 07/27/2011 1058   BILITOT 0.3 07/27/2011 1058       Impression and Plan: #1. IgA kappa multiple myeloma. No obvious response to treatment but no signs that the disease has progressed. Plan: She's had 6 months of treatment. I'm going to stop and give her a break and just monitor her laboratory parameters on a monthly basis. I don't think we are going to see much more of a response by continuing the current program. If she does show signs of progression I would consider Velcade dexamethasone or Revlimid dexamethasone.  #2. Stage I ovarian cancer. Status post unilateral oophorectomy May 2009.  Clinically stable no evidence for recurrence. Slowly rising CA 125 tumor marker of questionable significance. Please see discussion above. #3. History of severe C. difficile colitis in the past-resolved.  #4. Degenerative arthritis.  #5. Hypothyroid on replacement.  #6. Essential hypertension.  #7. History of pneumonia October 2010 colitis during that admission.  #8. History of a positive PPD. Granulomas on CT scan of the chest. Stable.    CC:. Dr. Kirby Funk. Dr. Thayer Ohm   Levert Feinstein, MD 5/1/20137:27 AM

## 2011-09-02 ENCOUNTER — Other Ambulatory Visit (HOSPITAL_BASED_OUTPATIENT_CLINIC_OR_DEPARTMENT_OTHER): Payer: Medicare Other | Admitting: Lab

## 2011-09-02 DIAGNOSIS — C562 Malignant neoplasm of left ovary: Secondary | ICD-10-CM

## 2011-09-02 DIAGNOSIS — C9000 Multiple myeloma not having achieved remission: Secondary | ICD-10-CM

## 2011-09-02 LAB — CBC WITH DIFFERENTIAL/PLATELET
EOS%: 1.7 % (ref 0.0–7.0)
Eosinophils Absolute: 0.1 10*3/uL (ref 0.0–0.5)
MCH: 35.4 pg — ABNORMAL HIGH (ref 25.1–34.0)
MCV: 105.5 fL — ABNORMAL HIGH (ref 79.5–101.0)
MONO%: 13.9 % (ref 0.0–14.0)
NEUT#: 2.5 10*3/uL (ref 1.5–6.5)
RBC: 2.92 10*6/uL — ABNORMAL LOW (ref 3.70–5.45)
RDW: 17.1 % — ABNORMAL HIGH (ref 11.2–14.5)
lymph#: 0.7 10*3/uL — ABNORMAL LOW (ref 0.9–3.3)
nRBC: 0 % (ref 0–0)

## 2011-09-03 LAB — COMPREHENSIVE METABOLIC PANEL
BUN: 20 mg/dL (ref 6–23)
CO2: 25 mEq/L (ref 19–32)
Creatinine, Ser: 0.72 mg/dL (ref 0.50–1.10)
Glucose, Bld: 66 mg/dL — ABNORMAL LOW (ref 70–99)
Total Bilirubin: 0.3 mg/dL (ref 0.3–1.2)

## 2011-09-03 LAB — CA 125: CA 125: 28.7 U/mL (ref 0.0–30.2)

## 2011-09-03 LAB — IGA: IgA: 626 mg/dL — ABNORMAL HIGH (ref 69–380)

## 2011-09-30 ENCOUNTER — Other Ambulatory Visit: Payer: Medicare Other

## 2011-10-05 ENCOUNTER — Other Ambulatory Visit: Payer: Medicare Other

## 2011-10-05 DIAGNOSIS — C9000 Multiple myeloma not having achieved remission: Secondary | ICD-10-CM

## 2011-10-05 LAB — CBC WITH DIFFERENTIAL/PLATELET
BASO%: 0.5 % (ref 0.0–2.0)
Basophils Absolute: 0 10*3/uL (ref 0.0–0.1)
Eosinophils Absolute: 0.1 10*3/uL (ref 0.0–0.5)
HCT: 31.5 % — ABNORMAL LOW (ref 34.8–46.6)
HGB: 10.6 g/dL — ABNORMAL LOW (ref 11.6–15.9)
MCHC: 33.8 g/dL (ref 31.5–36.0)
MONO#: 0.6 10*3/uL (ref 0.1–0.9)
NEUT#: 3.4 10*3/uL (ref 1.5–6.5)
NEUT%: 70.6 % (ref 38.4–76.8)
WBC: 4.9 10*3/uL (ref 3.9–10.3)
lymph#: 0.8 10*3/uL — ABNORMAL LOW (ref 0.9–3.3)

## 2011-10-06 ENCOUNTER — Ambulatory Visit (HOSPITAL_BASED_OUTPATIENT_CLINIC_OR_DEPARTMENT_OTHER): Payer: Medicare Other | Admitting: Oncology

## 2011-10-06 ENCOUNTER — Telehealth: Payer: Self-pay | Admitting: Oncology

## 2011-10-06 VITALS — BP 162/82 | HR 75 | Temp 97.0°F | Ht 63.0 in | Wt 128.4 lb

## 2011-10-06 DIAGNOSIS — C569 Malignant neoplasm of unspecified ovary: Secondary | ICD-10-CM

## 2011-10-06 DIAGNOSIS — C9000 Multiple myeloma not having achieved remission: Secondary | ICD-10-CM

## 2011-10-06 LAB — COMPREHENSIVE METABOLIC PANEL
AST: 21 U/L (ref 0–37)
Albumin: 4.2 g/dL (ref 3.5–5.2)
Alkaline Phosphatase: 68 U/L (ref 39–117)
BUN: 21 mg/dL (ref 6–23)
Creatinine, Ser: 0.83 mg/dL (ref 0.50–1.10)
Glucose, Bld: 71 mg/dL (ref 70–99)
Total Bilirubin: 0.3 mg/dL (ref 0.3–1.2)

## 2011-10-06 LAB — KAPPA/LAMBDA LIGHT CHAINS
Kappa:Lambda Ratio: 7.4 — ABNORMAL HIGH (ref 0.26–1.65)
Lambda Free Lght Chn: 0.82 mg/dL (ref 0.57–2.63)

## 2011-10-06 NOTE — Progress Notes (Signed)
Hematology and Oncology Follow Up Visit  Heidi Pratt [id number] 08/07/1918 78 y.o. 10/06/2011 7:18 PM   Principle Diagnosis: Encounter Diagnoses  Name Primary?  . Multiple myeloma without mention of remission Yes  . 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 She was diagnosed with IgA multiple myeloma when further evaluation of a normochromic anemia in November 2012 showed elevation of IgA immunoglobulin with kappa light chain restriction and a bone marrow biopsy with 31% plasma cells. She was  started on a trial of oral Alkeran and prednisone in November 2012 which was continued for 6 months through May of 2013. There was no significant improvement in her IgA level and I stopped the treatment after her visit here in May. Despite what appeared to be a significant tumor burden at diagnosis, there has been only minimal change in her IgA over the last 6 months. Values are only rising slowly. Repeat value obtained yesterday on July 1 of 716 mg percent compared with 609 mg percent at diagnosis recorded in September 2012. Kappa free light chains only mildly elevated current value 6.1 mg percent normal up to 1.94 with value at time of diagnosis of 3.7. She has no bone pain and no bone lesions on initial x-rays. She's had wide fluctuations of her CA 125 tumor marker which rises with acute illness and infection with no indication for a  progressive or sustained rise to suggest recurrent ovarian cancer. She denies any abdominal distention or vaginal bleeding at this time.  Medications: reviewed  Allergies:  Allergies  Allergen Reactions  . Amoxicillin Diarrhea  . Crab (Shellfish Allergy) Nausea And Vomiting    Review of Systems: Constitutional:   No constitutional symptoms Respiratory: No  cough or dyspnea Cardiovascular:  No chest pain or palpitations Gastrointestinal: No abdominal pain no change in bowel habit Genito-Urinary: See above Musculoskeletal: No myeloma related bone pain. She has degenerative arthritis of her hands. Neurologic: No headache or change in vision Skin: No rash Remaining ROS negative.  Physical Exam: Blood pressure 162/82, pulse 75, temperature 97 F (36.1 C), temperature source Oral, height 5\' 3"  (1.6 m), weight 128 lb 6.4 oz (58.242 kg). Wt Readings from Last 3 Encounters:  10/06/11 128 lb 6.4 oz (58.242 kg)  08/04/11 128 lb 14.4 oz (58.469 kg)  06/02/11 128 lb 6.4 oz (58.242 kg)     General appearance: Thin, pleasant, Caucasian woman HENNT: Pharynx no erythema or exudate Lymph nodes: No adenopathy Breasts: Not examined Lungs: Clear to auscultation resonant to percussion Heart: Regular rhythm Abdomen: Soft nontender no mass no organomegaly Extremities: No edema no calf tenderness Vascular: No cyanosis Neurologic: No focal deficit Skin: Scattered ecchymoses on her forearms  Lab Results: Lab Results  Component Value Date   WBC 4.9 10/05/2011   HGB 10.6* 10/05/2011   HCT 31.5* 10/05/2011   MCV 104.0* 10/05/2011   PLT 212 10/05/2011     Chemistry      Component Value Date/Time   NA 137 10/05/2011 1122   K 4.5 10/05/2011 1122   CL 101 10/05/2011 1122   CO2 24 10/05/2011 1122   BUN 21 10/05/2011 1122   CREATININE 0.83 10/05/2011 1122   CREATININE 0.72 12/26/2010 1039   CREATININE 0.72 12/26/2010 1039   CREATININE 0.72 12/26/2010 1039  Component Value Date/Time   CALCIUM 9.1 10/05/2011 1122   ALKPHOS 68 10/05/2011 1122   AST 21 10/05/2011 1122   ALT 16 10/05/2011 1122   BILITOT 0.3 10/05/2011 1122       Radiological Studies: No results found.  Impression and Plan: #1. IgA kappa multiple myeloma. She appears very indolent disease. There has been little change in her hematologic profile since diagnosis almost a year ago. Minimal decrease in her IgA  levels on Alkeran and prednisone which I stopped last month.  We reviewed strategies going into the future. I gave her 2 options. One is to continue close observation with lab analysis every 2 months and clinical exams every 4 months reinitiating therapy only if the disease appears to progress or cause symptoms. The other option would be to start her on a trial of lenalidomide, oral immunomodulatory drug. We reviewed potential side effects of this drug. She is quite comfortable with the close observation approach. Her physician son accompanies her today and he now supports her decision.  #2. Stage I ovarian cancer No clinical evidence for recurrence at this time. I will continue to monitor tumor markers on an every 4 month basis.  CC:.  Dr. Kirby Funk; Dr. Sheldon Silvan  Levert Feinstein, MD 7/2/20137:18 PM

## 2011-10-06 NOTE — Telephone Encounter (Signed)
appts made and printed for pt aom °

## 2011-12-08 ENCOUNTER — Other Ambulatory Visit (HOSPITAL_BASED_OUTPATIENT_CLINIC_OR_DEPARTMENT_OTHER): Payer: Medicare Other

## 2011-12-08 DIAGNOSIS — C9000 Multiple myeloma not having achieved remission: Secondary | ICD-10-CM

## 2011-12-08 DIAGNOSIS — C569 Malignant neoplasm of unspecified ovary: Secondary | ICD-10-CM

## 2011-12-08 LAB — CBC WITH DIFFERENTIAL/PLATELET
Basophils Absolute: 0 10*3/uL (ref 0.0–0.1)
EOS%: 0.6 % (ref 0.0–7.0)
Eosinophils Absolute: 0 10*3/uL (ref 0.0–0.5)
HCT: 31.1 % — ABNORMAL LOW (ref 34.8–46.6)
HGB: 10.7 g/dL — ABNORMAL LOW (ref 11.6–15.9)
MCH: 34.8 pg — ABNORMAL HIGH (ref 25.1–34.0)
MCV: 100.6 fL (ref 79.5–101.0)
MONO%: 11.2 % (ref 0.0–14.0)
NEUT#: 3.6 10*3/uL (ref 1.5–6.5)
NEUT%: 72 % (ref 38.4–76.8)
RDW: 16.1 % — ABNORMAL HIGH (ref 11.2–14.5)

## 2011-12-08 LAB — COMPREHENSIVE METABOLIC PANEL (CC13)
ALT: 18 U/L (ref 0–55)
AST: 22 U/L (ref 5–34)
CO2: 24 mEq/L (ref 22–29)
Calcium: 8.8 mg/dL (ref 8.4–10.4)
Chloride: 102 mEq/L (ref 98–107)
Creatinine: 0.9 mg/dL (ref 0.6–1.1)
Sodium: 136 mEq/L (ref 136–145)
Total Protein: 6.5 g/dL (ref 6.4–8.3)

## 2011-12-09 LAB — KAPPA/LAMBDA LIGHT CHAINS
Kappa:Lambda Ratio: 7.93 — ABNORMAL HIGH (ref 0.26–1.65)
Lambda Free Lght Chn: 0.61 mg/dL (ref 0.57–2.63)

## 2011-12-09 LAB — CA 125: CA 125: 31.6 U/mL — ABNORMAL HIGH (ref 0.0–30.2)

## 2012-02-08 ENCOUNTER — Other Ambulatory Visit (HOSPITAL_BASED_OUTPATIENT_CLINIC_OR_DEPARTMENT_OTHER): Payer: Medicare Other | Admitting: Lab

## 2012-02-08 DIAGNOSIS — C9000 Multiple myeloma not having achieved remission: Secondary | ICD-10-CM

## 2012-02-08 LAB — COMPREHENSIVE METABOLIC PANEL (CC13)
ALT: 17 U/L (ref 0–55)
AST: 21 U/L (ref 5–34)
Albumin: 3.5 g/dL (ref 3.5–5.0)
Alkaline Phosphatase: 68 U/L (ref 40–150)
BUN: 23 mg/dL (ref 7.0–26.0)
Calcium: 9.2 mg/dL (ref 8.4–10.4)
Chloride: 103 mEq/L (ref 98–107)
Potassium: 4.5 mEq/L (ref 3.5–5.1)
Sodium: 136 mEq/L (ref 136–145)
Total Protein: 6.3 g/dL — ABNORMAL LOW (ref 6.4–8.3)

## 2012-02-08 LAB — CBC WITH DIFFERENTIAL/PLATELET
BASO%: 0.5 % (ref 0.0–2.0)
EOS%: 1.2 % (ref 0.0–7.0)
HCT: 31.3 % — ABNORMAL LOW (ref 34.8–46.6)
LYMPH%: 15.5 % (ref 14.0–49.7)
MCH: 34.3 pg — ABNORMAL HIGH (ref 25.1–34.0)
MCHC: 33.8 g/dL (ref 31.5–36.0)
NEUT%: 70.6 % (ref 38.4–76.8)
Platelets: 222 10*3/uL (ref 145–400)
RBC: 3.09 10*6/uL — ABNORMAL LOW (ref 3.70–5.45)
WBC: 4.5 10*3/uL (ref 3.9–10.3)

## 2012-02-10 LAB — IMMUNOFIXATION ELECTROPHORESIS: Total Protein, Serum Electrophoresis: 6.4 g/dL (ref 6.0–8.3)

## 2012-02-10 LAB — KAPPA/LAMBDA LIGHT CHAINS
Kappa:Lambda Ratio: 11.53 — ABNORMAL HIGH (ref 0.26–1.65)
Lambda Free Lght Chn: 0.55 mg/dL — ABNORMAL LOW (ref 0.57–2.63)

## 2012-02-15 ENCOUNTER — Ambulatory Visit (HOSPITAL_BASED_OUTPATIENT_CLINIC_OR_DEPARTMENT_OTHER): Payer: Medicare Other | Admitting: Oncology

## 2012-02-15 VITALS — BP 182/85 | HR 73 | Temp 97.4°F | Resp 18 | Ht 63.0 in | Wt 129.8 lb

## 2012-02-15 DIAGNOSIS — C569 Malignant neoplasm of unspecified ovary: Secondary | ICD-10-CM

## 2012-02-15 DIAGNOSIS — C9000 Multiple myeloma not having achieved remission: Secondary | ICD-10-CM

## 2012-02-15 NOTE — Patient Instructions (Signed)
Continue lab testing every 2 months next 04/11/12 Visit with Dr Reece Agar June 13, 2012 @ 3 PM

## 2012-02-16 ENCOUNTER — Telehealth: Payer: Self-pay | Admitting: *Deleted

## 2012-02-16 NOTE — Progress Notes (Signed)
Hematology and Oncology Follow Up Visit  Heidi Pratt [id number] 1919/01/01 78 y.o. 02/16/2012 9:21 AM   Principle Diagnosis: Encounter Diagnoses  Name Primary?  . Multiple myeloma without mention of remission Yes  . Ovarian epithelial cancer      Interim History:    Followup visit for this delightful  78 year old woman  diagnosed with IgA kappa multiple myeloma when she was evaluated for a normochromic anemia in September of 2012. Bone marrow biopsy was done in November and showed 31% plasma cells. There were no lytic lesions on bone survey. No significant proteinuria but there was monoclonal free kappa light chain on immunofixation electrophoresis. Serum free light chains with a slight elevation of kappa 3.69 mg percent normal up to 1.94; borderline elevation of beta-2- microglobulin 1.78 normal up to 1.73, An initial serum IgA immunoglobulin 609 mg percent (68-380) with concomitant suppression of IgG and IgM.  After a lengthy discussion she agreed to begin treatment with Alkeran and prednisone. In view of her age she was started on 5 mg per meter squared of Alkeran total dose 8 mg daily 4 days each month and prednisone 40 mg daily 7 days each month. Overall she  tolerated treatment well. She really did not like the feeling that she got when she was on the steroids. No GI complaints but just a sense that she is not herself.   She had  4 cycles of treatment beginning on 02/09/2011. She had a minimal response. I escalated the Alkeran dose going into cycle 5. I deleted the steroids due to poor tolerance. She received a total of 6 cycles through April 30. Although she did not get a major response, overall hematologic parameters have remained stable. Hemoglobin was 11.5 at initiation of treatment, fell to a nadir of 10.1 on treatment and is currently 10.6 g. White count is low normal and platelets are normal and both have been stable throughout. Total serum IgA which was 609 mg percent at time  of diagnosis in September 2012 and fell to a nadir of 444 mg percent by April 2013, has been slowly rising with current value 822 mg percent compared with 783 in September and 716 in July. Free light chains which were 3.69 mg percent at diagnosis in September 2012,  fell to a nadir of 2.69 mg percent by April 2013, now slowly rising to current value on 02/08/2012 of 6.34 mg percent Kappa./Lambda ratio however remains stable compared with previous values. She remains asymptomatic.  With respect to her ovarian cancer, she denies any abdominal pain or swelling, no vaginal bleeding. She has had rather marked fluctuations in her CA 125 tumor marker which has been as high as 165.5 units when she gets an infection. Current value 31.6 on 12/08/2011.   Medications: reviewed  Allergies:  Allergies  Allergen Reactions  . Amoxicillin Diarrhea  . Crab (Shellfish Allergy) Nausea And Vomiting    Review of Systems: Constitutional:   No constitutional symptoms. Her main limitation is due to her progressive degenerative arthritis. Respiratory: No cough or dyspnea Cardiovascular: No chest pain or palpitations  Gastrointestinal: See above Genito-Urinary: See above Musculoskeletal: Decreased mobility due to 2 degenerative arthritis Neurologic: No headache or change in vision. Skin: No rash or ecchymosis Remaining ROS negative.  Physical Exam: Blood pressure 182/85, pulse 73, temperature 97.4 F (36.3 C), temperature source Oral, resp. rate 18, height 5\' 3"  (1.6 m), weight 129 lb 12.8 oz (58.877 kg). Wt Readings from Last 3 Encounters:  02/15/12 129 lb 12.8  oz (58.877 kg)  10/06/11 128 lb 6.4 oz (58.242 kg)  08/04/11 128 lb 14.4 oz (58.469 kg)     General appearance: Well-nourished Caucasian woman HENNT: Pharynx no erythema or exudate Lymph nodes: No adenopathy Breasts: Lungs: Clear to auscultation resonant to percussion Heart: Regular rhythm no murmur Abdomen: Soft, chronically distended,  nontender, no mass, no organomegaly, no fluid wave Extremities: No edema, no calf tenderness, prominent Heberden's nodes Vascular: No cyanosis Neurologic: Alert and oriented, cranial nerves grossly normal, motor strength 5 over 5, reflexes 1+ symmetric, sensation slightly decreased vibration sensation by tuning fork exam Skin: No rash or ecchymosis  Lab Results: Lab Results  Component Value Date   WBC 4.5 02/08/2012   HGB 10.6* 02/08/2012   HCT 31.3* 02/08/2012   MCV 101.5* 02/08/2012   PLT 222 02/08/2012     Chemistry      Component Value Date/Time   NA 136 02/08/2012 1116   NA 137 10/05/2011 1122   K 4.5 02/08/2012 1116   K 4.5 10/05/2011 1122   CL 103 02/08/2012 1116   CL 101 10/05/2011 1122   CO2 26 02/08/2012 1116   CO2 24 10/05/2011 1122   BUN 23.0 02/08/2012 1116   BUN 21 10/05/2011 1122   CREATININE 0.8 02/08/2012 1116   CREATININE 0.83 10/05/2011 1122   CREATININE 0.72 12/26/2010 1039   CREATININE 0.72 12/26/2010 1039   CREATININE 0.72 12/26/2010 1039      Component Value Date/Time   CALCIUM 9.2 02/08/2012 1116   CALCIUM 9.1 10/05/2011 1122   ALKPHOS 68 02/08/2012 1116   ALKPHOS 68 10/05/2011 1122   AST 21 02/08/2012 1116   AST 21 10/05/2011 1122   ALT 17 02/08/2012 1116   ALT 16 10/05/2011 1122   BILITOT 0.24 02/08/2012 1116   BILITOT 0.3 10/05/2011 1122       Impression and Plan: #1. IgA Multiple myeloma Minimal response to 6 months of Alkeran plus prednisone however very indolent disease with a stable hematologic profile. Slowly rising total IgA level. I still feel comfortable with close observation alone. She is really not interested in aggressive therapy. However she might benefit from a trial of Revlimid in the future. I would start at a low 5 mg dose and then escalate based on tolerance and response. I will continue lab every 2 months and a clinical visit in 4 months.  #2. Ovarian cancer. Likely stage I. Status post unilateral oophorectomy in May 2009. No gross evidence for  recurrence. Fluctuating CA 125 levels have not been a reliable indicator of her disease activity.  #3. History of severe C. difficile colitis in the past-resolved.  #4. Degenerative arthritis.  #5. Hypothyroid on replacement.  #6. Essential hypertension.  #7. History of pneumonia October 2010 colitis during that admission.  #8. History of a positive PPD. Granulomas on CT scan of the chest. Stable.    CC:. Dr. Kirby Funk; Dr. Thayer Ohm   Levert Feinstein, MD 11/12/20139:21 AM

## 2012-02-16 NOTE — Telephone Encounter (Signed)
Left voice message to inform the patient of the new date and time on 06-13-2012 starting at 3:00pm

## 2012-03-28 ENCOUNTER — Other Ambulatory Visit: Payer: Self-pay | Admitting: Oncology

## 2012-04-06 DIAGNOSIS — I82409 Acute embolism and thrombosis of unspecified deep veins of unspecified lower extremity: Secondary | ICD-10-CM

## 2012-04-06 HISTORY — DX: Acute embolism and thrombosis of unspecified deep veins of unspecified lower extremity: I82.409

## 2012-06-13 ENCOUNTER — Ambulatory Visit (HOSPITAL_BASED_OUTPATIENT_CLINIC_OR_DEPARTMENT_OTHER): Payer: Medicare Other | Admitting: Oncology

## 2012-06-13 ENCOUNTER — Telehealth: Payer: Self-pay | Admitting: Oncology

## 2012-06-13 ENCOUNTER — Other Ambulatory Visit (HOSPITAL_BASED_OUTPATIENT_CLINIC_OR_DEPARTMENT_OTHER): Payer: Medicare Other | Admitting: Lab

## 2012-06-13 VITALS — BP 163/73 | HR 72 | Temp 98.1°F | Resp 18 | Ht 63.0 in | Wt 131.0 lb

## 2012-06-13 DIAGNOSIS — C9000 Multiple myeloma not having achieved remission: Secondary | ICD-10-CM

## 2012-06-13 DIAGNOSIS — C569 Malignant neoplasm of unspecified ovary: Secondary | ICD-10-CM

## 2012-06-13 DIAGNOSIS — Z8543 Personal history of malignant neoplasm of ovary: Secondary | ICD-10-CM

## 2012-06-13 LAB — COMPREHENSIVE METABOLIC PANEL (CC13)
Albumin: 3.5 g/dL (ref 3.5–5.0)
CO2: 25 mEq/L (ref 22–29)
Calcium: 8.9 mg/dL (ref 8.4–10.4)
Chloride: 100 mEq/L (ref 98–107)
Glucose: 95 mg/dl (ref 70–99)
Potassium: 4.4 mEq/L (ref 3.5–5.1)
Sodium: 135 mEq/L — ABNORMAL LOW (ref 136–145)
Total Bilirubin: 0.25 mg/dL (ref 0.20–1.20)
Total Protein: 6.6 g/dL (ref 6.4–8.3)

## 2012-06-13 LAB — CBC WITH DIFFERENTIAL/PLATELET
BASO%: 0.3 % (ref 0.0–2.0)
Basophils Absolute: 0 10*3/uL (ref 0.0–0.1)
EOS%: 0.8 % (ref 0.0–7.0)
MCH: 34.5 pg — ABNORMAL HIGH (ref 25.1–34.0)
MCHC: 34.4 g/dL (ref 31.5–36.0)
MCV: 100.1 fL (ref 79.5–101.0)
MONO%: 11 % (ref 0.0–14.0)
RBC: 2.88 10*6/uL — ABNORMAL LOW (ref 3.70–5.45)
RDW: 16.2 % — ABNORMAL HIGH (ref 11.2–14.5)
lymph#: 1 10*3/uL (ref 0.9–3.3)

## 2012-06-13 NOTE — Progress Notes (Signed)
Hematology and Oncology Follow Up Visit  Heidi Pratt [id number] 1918-10-28 79 y.o. 06/13/2012 5:36 PM   Principle Diagnosis: Encounter Diagnoses  Name Primary?  . Multiple myeloma without mention of remission Yes  . Ovarian epithelial cancer, left      Interim History:   Followup visit for this 79 year old woman with IgA kappa multiple myeloma. Little has changed since my 02/15/2012 note and I refer you to that note for full details of her initial diagnosis and treatments. She remains minimally symptomatic. She has had decreased exercise tolerance recently. She never had any bone involvement at time of initial diagnosis and has no complaints of bone pain today. Her hemoglobin has been running at 10 g plus or  Minus 0.5 g and is a little bit lower than her baseline today at 9.9. Renal function remains normal. IgA level and serum free light chains are pending.  Medications: reviewed  Allergies:  Allergies  Allergen Reactions  . Amoxicillin Diarrhea  . Crab (Shellfish Allergy) Nausea And Vomiting    Review of Systems: Constitutional:   See above Respiratory: No cough or dyspnea.  Cardiovascular: No chest pain or palpitations  Gastrointestinal: No abdominal pain Genito-Urinary: No vaginal bleeding Musculoskeletal: No bone pain except from her degenerative arthritis Neurologic: No headache or change in vision Skin: No rash Remaining ROS negative.  Physical Exam: Blood pressure 163/73, pulse 72, temperature 98.1 F (36.7 C), temperature source Oral, resp. rate 18, height 5\' 3"  (1.6 m), weight 131 lb (59.421 kg). Wt Readings from Last 3 Encounters:  06/13/12 131 lb (59.421 kg)  02/15/12 129 lb 12.8 oz (58.877 kg)  10/06/11 128 lb 6.4 oz (58.242 kg)     General appearance: Well-nourished Caucasian woman HENNT: Pharynx no erythema or exudate Lymph nodes: No cervical, supraclavicular, or axillary adenopathy Breasts: Lungs: Clear to auscultation resonant to  percussion Heart: Regular rhythm no murmur Abdomen: Soft, nontender, no mass, no organomegaly, no gross fluid wave Extremities: Extensive changes of osteoarthritis with Heberden's nodes of the fingers, no peripheral edema Vascular: No cyanosis Neurologic: Mental status intact, cranial nerves grossly normal, motor strength 5 over 5, reflexes 1+ symmetric Skin: No rash or ecchymosis  Lab Results: Lab Results  Component Value Date   WBC 5.3 06/13/2012   HGB 9.9* 06/13/2012   HCT 28.8* 06/13/2012   MCV 100.1 06/13/2012   PLT 203 06/13/2012     Chemistry      Component Value Date/Time   NA 135* 06/13/2012 1517   NA 137 10/05/2011 1122   K 4.4 06/13/2012 1517   K 4.5 10/05/2011 1122   CL 100 06/13/2012 1517   CL 101 10/05/2011 1122   CO2 25 06/13/2012 1517   CO2 24 10/05/2011 1122   BUN 23.6 06/13/2012 1517   BUN 21 10/05/2011 1122   CREATININE 0.8 06/13/2012 1517   CREATININE 0.83 10/05/2011 1122   CREATININE 0.72 12/26/2010 1039      Component Value Date/Time   CALCIUM 8.9 06/13/2012 1517   CALCIUM 9.1 10/05/2011 1122   ALKPHOS 73 06/13/2012 1517   ALKPHOS 68 10/05/2011 1122   AST 23 06/13/2012 1517   AST 21 10/05/2011 1122   ALT 18 06/13/2012 1517   ALT 16 10/05/2011 1122   BILITOT 0.25 06/13/2012 1517   BILITOT 0.3 10/05/2011 1122       Radiological Studies: No results found.  Impression and Plan: #1. IgA kappa multiple myeloma Indolent disease, slowly progressive off treatment. Today's labs values are pending. She is accompanied by  her physician son. We discussed management going forward. One of the main considerations that came out of this year's [employer] of hematology meeting in December 2013 was that a combination of Revlimid plus dexamethasone or Velcade plus dexamethasone was equivalent and in some cases superior to the regimens that also contain alkylating agents in elderly patients who were not transplant candidates. I propose that if we do need to put her back on treatment that we  use a combination of Revlimid starting at 10 mg 21 days on 7 days off and dexamethasone 20 mg by mouth weekly. I will call her when we have the results of the outstanding labs. I have scheduled a short interval followup visit for the end of April anticipating that we might need to put her on treatment and be able to monitor her more closely.  #2. Likely Stage I ovarian cancer status post unilateral oophorectomy May 2009. No gross evidence for recurrence at this time. Tumor marker today is pending.   #3. History of severe C. difficile colitis in the past-resolved.  #4. Degenerative arthritis.  #5. Hypothyroid on replacement.  #6. Essential hypertension.  #7. History of pneumonia October 2010 colitis during that admission.  #8. History of a positive PPD. Granulomas on CT scan of the chest. Stable.   CC:. Dr. Kirby Funk .  Levert Feinstein, MD 3/10/20145:36 PM

## 2012-06-13 NOTE — Telephone Encounter (Signed)
gv and printed appt schedule to pt for April

## 2012-06-16 LAB — IMMUNOFIXATION ELECTROPHORESIS
IgA: 740 mg/dL — ABNORMAL HIGH (ref 69–380)
IgG (Immunoglobin G), Serum: 394 mg/dL — ABNORMAL LOW (ref 690–1700)

## 2012-06-16 LAB — KAPPA/LAMBDA LIGHT CHAINS: Kappa free light chain: 7.06 mg/dL — ABNORMAL HIGH (ref 0.33–1.94)

## 2012-06-17 ENCOUNTER — Telehealth: Payer: Self-pay | Admitting: *Deleted

## 2012-06-17 NOTE — Telephone Encounter (Signed)
Received call from pt requesting results of labs.  Message left on pt's identified vm that Dr. Cyndie Chime out of office today & will leave message to call her next week.

## 2012-06-22 ENCOUNTER — Telehealth: Payer: Self-pay | Admitting: *Deleted

## 2012-06-22 NOTE — Telephone Encounter (Signed)
Pt states Dr. Cyndie Chime called & stated her lab was elevated & he wanted to schedule an [place] of ovaries & she would like to set that up.  Will discuss with Dr Cyndie Chime.  Dr. Cyndie Chime wants to know if pt has a local GYN office that it can be done at or does she want this in the hosp.Marland Kitchen  Pt informed us that she wanted this done at the hosp.

## 2012-06-23 ENCOUNTER — Other Ambulatory Visit: Payer: Self-pay | Admitting: Oncology

## 2012-06-27 ENCOUNTER — Other Ambulatory Visit: Payer: Self-pay | Admitting: Oncology

## 2012-06-27 ENCOUNTER — Ambulatory Visit (HOSPITAL_COMMUNITY)
Admission: RE | Admit: 2012-06-27 | Discharge: 2012-06-27 | Disposition: A | Discharge status: Home / Self Care | Payer: Medicare Other | Source: Ambulatory Visit | Attending: Oncology | Admitting: Oncology

## 2012-06-27 DIAGNOSIS — Z8543 Personal history of malignant neoplasm of ovary: Secondary | ICD-10-CM | POA: Insufficient documentation

## 2012-06-27 DIAGNOSIS — C562 Malignant neoplasm of left ovary: Secondary | ICD-10-CM

## 2012-06-27 DIAGNOSIS — R971 Elevated cancer antigen 125 [CA 125]: Secondary | ICD-10-CM | POA: Insufficient documentation

## 2012-06-29 ENCOUNTER — Telehealth: Payer: Self-pay | Admitting: *Deleted

## 2012-06-29 NOTE — Telephone Encounter (Signed)
Pt returned call from yest & message given to her from Dr. Vedia Pereyra evidence for recurrent ovarian cancer on u/s.  She was very pleased.

## 2012-06-29 NOTE — Telephone Encounter (Signed)
Message copied by Sabino Snipes on Wed Jun 29, 2012  9:32 AM ------      Message from: Levert Feinstein      Created: Mon Jun 27, 2012  6:53 PM       Call pt - no evidence for recurrent ovarian cancer on ultrasound ------

## 2012-07-25 ENCOUNTER — Other Ambulatory Visit (HOSPITAL_BASED_OUTPATIENT_CLINIC_OR_DEPARTMENT_OTHER): Payer: Medicare Other

## 2012-07-25 DIAGNOSIS — C9000 Multiple myeloma not having achieved remission: Secondary | ICD-10-CM

## 2012-07-25 DIAGNOSIS — C562 Malignant neoplasm of left ovary: Secondary | ICD-10-CM

## 2012-07-25 DIAGNOSIS — C569 Malignant neoplasm of unspecified ovary: Secondary | ICD-10-CM

## 2012-07-25 LAB — CBC WITH DIFFERENTIAL/PLATELET
BASO%: 0.6 % (ref 0.0–2.0)
EOS%: 2.3 % (ref 0.0–7.0)
HCT: 29.6 % — ABNORMAL LOW (ref 34.8–46.6)
LYMPH%: 17.1 % (ref 14.0–49.7)
MCH: 33.9 pg (ref 25.1–34.0)
MCHC: 34 g/dL (ref 31.5–36.0)
NEUT%: 67.9 % (ref 38.4–76.8)
Platelets: 193 10*3/uL (ref 145–400)
RBC: 2.97 10*6/uL — ABNORMAL LOW (ref 3.70–5.45)
lymph#: 0.8 10*3/uL — ABNORMAL LOW (ref 0.9–3.3)

## 2012-07-27 LAB — KAPPA/LAMBDA LIGHT CHAINS
Kappa:Lambda Ratio: 9.3 — ABNORMAL HIGH (ref 0.26–1.65)
Lambda Free Lght Chn: 0.73 mg/dL (ref 0.57–2.63)

## 2012-08-01 ENCOUNTER — Telehealth: Payer: Self-pay | Admitting: Oncology

## 2012-08-01 ENCOUNTER — Ambulatory Visit (HOSPITAL_BASED_OUTPATIENT_CLINIC_OR_DEPARTMENT_OTHER): Payer: Medicare Other | Admitting: Oncology

## 2012-08-01 VITALS — BP 167/66 | HR 79 | Temp 97.5°F | Resp 20 | Ht 63.0 in | Wt 130.7 lb

## 2012-08-01 DIAGNOSIS — C562 Malignant neoplasm of left ovary: Secondary | ICD-10-CM

## 2012-08-01 DIAGNOSIS — C569 Malignant neoplasm of unspecified ovary: Secondary | ICD-10-CM

## 2012-08-01 DIAGNOSIS — M1990 Unspecified osteoarthritis, unspecified site: Secondary | ICD-10-CM

## 2012-08-01 DIAGNOSIS — C9000 Multiple myeloma not having achieved remission: Secondary | ICD-10-CM

## 2012-08-01 NOTE — Telephone Encounter (Signed)
gv pt appt schedule for July. Pt aware central will call w/bone survey appt. Added note to bone survey order to schedule appt prior to 7/15 f/u.

## 2012-08-01 NOTE — Progress Notes (Signed)
Hematology and Oncology Follow Up Visit  Heidi Pratt [id number] 06-18-1918 79 y.o. 08/01/2012 6:37 PM   Principle Diagnosis: Encounter Diagnoses  Name Primary?  . Multiple myeloma without mention of remission Yes  . Ovarian epithelial cancer, left      Interim History:   Followup visit for this pleasant 79 year old woman with IgA kappa multiple myeloma diagnosed in October 2012 when she presented with mild anemia. She was on initial treatment with oral melphalan and prednisone. She did not tolerate the steroids. She stayed on the melphalan for about 6 months through May of 2013. There was really no major impact on her disease. She has been off treatment now for almost one year. Her hematologic profile has remained stable with a normal white count, platelet count, and persistent but unchanged mild normochromic anemia with hemoglobin 10 g. She never had any bone disease and has no bone complaints today other than her advanced osteoarthritis.  Lab monitoring shows a slow rise in her IgA paraprotein now up to 912 mg percent as of April 21. This compares with 740 on March 10, 822 on 02/08/2012, and 444  in April of last year. Of free light chains have been relatively stable with current value 6.79 mg percent compared with 7.06% in March,2014,  6.3% in November 2013 ,4.8 mg percent in September 2013.  She has a history of stage I ovarian cancer status post unilateral left oophorectomy. She has had wide fluctuations in CA 125 tumor marker. This tends to go up at times of acute infection. There was a recent elevation to 61.8 units on 06/13/2012 without any good explanation. I obtained a abdominal and transvaginal ultrasound on March 24. There is good visualization of the abdomen and pelvis. No abnormal findings and no ascites.   Medications: reviewed  Allergies:  Allergies  Allergen Reactions  . Amoxicillin Diarrhea  . Crab (Shellfish Allergy) Nausea And Vomiting    Review of  Systems: Constitutional:   No constitutional symptoms. She still walks 4 times a week. Respiratory: Dyspnea on exertion see below Cardiovascular:  She is getting some dyspnea on exertion some of which she attributes to seasonal rhinitis. No orthopnea. No chest pain or palpitations. Gastrointestinal: No abdominal pain or change in bowel habit Genito-Urinary: Not questioned Musculoskeletal: Chronic arthritis pain Neurologic: No headache or change in vision Skin: No rash Remaining ROS negative.  Physical Exam: Blood pressure 167/66, pulse 79, temperature 97.5 F (36.4 C), temperature source Oral, resp. rate 20, height 5\' 3"  (1.6 m), weight 130 lb 11.2 oz (59.285 kg). Wt Readings from Last 3 Encounters:  08/01/12 130 lb 11.2 oz (59.285 kg)  06/13/12 131 lb (59.421 kg)  02/15/12 129 lb 12.8 oz (58.877 kg)     General appearance: Pleasant Caucasian woman HENNT: Pharynx no erythema or exudate Lymph nodes: No adenopathy Breasts: Lungs: Clear to auscultation resonant to percussion Heart: Regular rhythm no murmur Abdomen: Soft, nontender, no mass, no organomegaly Extremities: No edema, no calf tenderness; extensive osteoarthritis changes of her fingers Vascular: No cyanosis Neurologic: Grossly normal motor strength 5 over 5, reflexes 1+ symmetric Skin: No rash. Small ecchymosis dorsum left hand  Lab Results: Lab Results  Component Value Date   WBC 4.8 07/25/2012   HGB 10.1* 07/25/2012   HCT 29.6* 07/25/2012   MCV 99.5 07/25/2012   PLT 193 07/25/2012     Chemistry      Component Value Date/Time   NA 135* 06/13/2012 1517   NA 137 10/05/2011 1122   K  4.4 06/13/2012 1517   K 4.5 10/05/2011 1122   CL 100 06/13/2012 1517   CL 101 10/05/2011 1122   CO2 25 06/13/2012 1517   CO2 24 10/05/2011 1122   BUN 23.6 06/13/2012 1517   BUN 21 10/05/2011 1122   CREATININE 0.8 06/13/2012 1517   CREATININE 0.83 10/05/2011 1122   CREATININE 0.72 12/26/2010 1039      Component Value Date/Time   CALCIUM 8.9  06/13/2012 1517   CALCIUM 9.1 10/05/2011 1122   ALKPHOS 73 06/13/2012 1517   ALKPHOS 68 10/05/2011 1122   AST 23 06/13/2012 1517   AST 21 10/05/2011 1122   ALT 18 06/13/2012 1517   ALT 16 10/05/2011 1122   BILITOT 0.25 06/13/2012 1517   BILITOT 0.3 10/05/2011 1122     Impression and Plan: #1. IgA kappa multiple myeloma Fortunately she has very indolent disease which has not progressed significantly off therapy now for over one year. She has a slowly rising IgA paraprotein but a stable hematologic profile and renal function. I will continue to follow her closely with every other month lab and clinical visits. She is accompanied by her physician son and the plan was reviewed.  #2. Stage I cancer of the ovary treated with laparoscopic oophorectomy in May 2009. Despite what appeared to be random fluctuations in her CA 125 tumor marker, there is been no evidence for new disease now at almost 5 years.  #3. Osteoarthritis  CC:.    Levert Feinstein, MD 4/28/20146:37 PM

## 2012-08-25 ENCOUNTER — Other Ambulatory Visit: Payer: Self-pay | Admitting: Internal Medicine

## 2012-08-25 DIAGNOSIS — R51 Headache: Secondary | ICD-10-CM

## 2012-08-26 ENCOUNTER — Other Ambulatory Visit: Payer: Self-pay | Admitting: Internal Medicine

## 2012-08-26 ENCOUNTER — Ambulatory Visit
Admission: RE | Admit: 2012-08-26 | Discharge: 2012-08-26 | Disposition: A | Discharge status: Home / Self Care | Payer: Medicare Other | Source: Ambulatory Visit | Attending: Internal Medicine | Admitting: Internal Medicine

## 2012-08-26 DIAGNOSIS — R51 Headache: Secondary | ICD-10-CM

## 2012-08-26 DIAGNOSIS — M542 Cervicalgia: Secondary | ICD-10-CM

## 2012-08-30 ENCOUNTER — Other Ambulatory Visit: Payer: Medicare Other

## 2012-09-02 ENCOUNTER — Other Ambulatory Visit (HOSPITAL_COMMUNITY): Payer: Self-pay | Admitting: Internal Medicine

## 2012-09-02 DIAGNOSIS — J90 Pleural effusion, not elsewhere classified: Secondary | ICD-10-CM

## 2012-09-05 ENCOUNTER — Ambulatory Visit (HOSPITAL_COMMUNITY)
Admission: RE | Admit: 2012-09-05 | Discharge: 2012-09-05 | Disposition: A | Discharge status: Home / Self Care | Payer: Medicare Other | Source: Ambulatory Visit | Attending: Internal Medicine | Admitting: Internal Medicine

## 2012-09-05 ENCOUNTER — Ambulatory Visit (HOSPITAL_COMMUNITY)
Admission: RE | Admit: 2012-09-05 | Discharge: 2012-09-05 | Disposition: A | Discharge status: Home / Self Care | Payer: Medicare Other | Source: Ambulatory Visit | Attending: Radiology | Admitting: Radiology

## 2012-09-05 VITALS — BP 157/80

## 2012-09-05 DIAGNOSIS — C569 Malignant neoplasm of unspecified ovary: Secondary | ICD-10-CM | POA: Insufficient documentation

## 2012-09-05 DIAGNOSIS — J90 Pleural effusion, not elsewhere classified: Secondary | ICD-10-CM

## 2012-09-05 DIAGNOSIS — C9000 Multiple myeloma not having achieved remission: Secondary | ICD-10-CM | POA: Insufficient documentation

## 2012-09-05 DIAGNOSIS — I517 Cardiomegaly: Secondary | ICD-10-CM | POA: Insufficient documentation

## 2012-09-05 LAB — LACTATE DEHYDROGENASE, PLEURAL OR PERITONEAL FLUID: LD, Fluid: 388 U/L — ABNORMAL HIGH (ref 3–23)

## 2012-09-05 LAB — BODY FLUID CELL COUNT WITH DIFFERENTIAL

## 2012-09-05 LAB — PROTEIN, BODY FLUID: Total protein, fluid: 4.3 g/dL

## 2012-09-05 NOTE — Procedures (Signed)
US guided diagnostic/therapeutic right thoracentesis performed yielding 630 cc's slightly turbid, yellow fluid. The fluid was sent to the lab for preordered studies. F/u CXR pending. No immediate complications.

## 2012-09-09 LAB — BODY FLUID CULTURE: Gram Stain: NONE SEEN

## 2012-09-12 ENCOUNTER — Other Ambulatory Visit: Payer: Self-pay | Admitting: Oncology

## 2012-09-12 DIAGNOSIS — J910 Malignant pleural effusion: Secondary | ICD-10-CM

## 2012-09-12 DIAGNOSIS — C9000 Multiple myeloma not having achieved remission: Secondary | ICD-10-CM

## 2012-09-13 ENCOUNTER — Telehealth: Payer: Self-pay | Admitting: Oncology

## 2012-09-13 NOTE — Telephone Encounter (Signed)
s.w. pt son regarding to 6.12.14 appt...ok and already aware

## 2012-09-15 ENCOUNTER — Other Ambulatory Visit: Payer: Self-pay | Admitting: *Deleted

## 2012-09-15 ENCOUNTER — Ambulatory Visit (HOSPITAL_BASED_OUTPATIENT_CLINIC_OR_DEPARTMENT_OTHER): Payer: Medicare Other | Admitting: Oncology

## 2012-09-15 ENCOUNTER — Other Ambulatory Visit (HOSPITAL_BASED_OUTPATIENT_CLINIC_OR_DEPARTMENT_OTHER): Payer: Medicare Other | Admitting: Lab

## 2012-09-15 ENCOUNTER — Encounter: Payer: Self-pay | Admitting: Oncology

## 2012-09-15 VITALS — BP 154/69 | HR 79 | Temp 97.3°F | Resp 20 | Ht 63.0 in | Wt 125.5 lb

## 2012-09-15 DIAGNOSIS — J90 Pleural effusion, not elsewhere classified: Secondary | ICD-10-CM

## 2012-09-15 DIAGNOSIS — C569 Malignant neoplasm of unspecified ovary: Secondary | ICD-10-CM

## 2012-09-15 DIAGNOSIS — J910 Malignant pleural effusion: Secondary | ICD-10-CM

## 2012-09-15 DIAGNOSIS — C9000 Multiple myeloma not having achieved remission: Secondary | ICD-10-CM

## 2012-09-15 HISTORY — DX: Pleural effusion, not elsewhere classified: J90

## 2012-09-15 LAB — CBC WITH DIFFERENTIAL/PLATELET
BASO%: 0.6 % (ref 0.0–2.0)
Basophils Absolute: 0 10e3/uL (ref 0.0–0.1)
EOS%: 1.6 % (ref 0.0–7.0)
Eosinophils Absolute: 0.1 10e3/uL (ref 0.0–0.5)
HCT: 28.3 % — ABNORMAL LOW (ref 34.8–46.6)
HGB: 10 g/dL — ABNORMAL LOW (ref 11.6–15.9)
LYMPH%: 13.7 % — ABNORMAL LOW (ref 14.0–49.7)
MCH: 35 pg — ABNORMAL HIGH (ref 25.1–34.0)
MCHC: 35.3 g/dL (ref 31.5–36.0)
MCV: 99.3 fL (ref 79.5–101.0)
MONO#: 0.7 10e3/uL (ref 0.1–0.9)
MONO%: 11.3 % (ref 0.0–14.0)
NEUT#: 4.3 10e3/uL (ref 1.5–6.5)
NEUT%: 72.8 % (ref 38.4–76.8)
Platelets: 216 10e3/uL (ref 145–400)
RBC: 2.85 10e6/uL — ABNORMAL LOW (ref 3.70–5.45)
RDW: 16.9 % — ABNORMAL HIGH (ref 11.2–14.5)
WBC: 5.9 10e3/uL (ref 3.9–10.3)
lymph#: 0.8 10e3/uL — ABNORMAL LOW (ref 0.9–3.3)

## 2012-09-15 LAB — COMPREHENSIVE METABOLIC PANEL (CC13)
ALT: 18 U/L (ref 0–55)
BUN: 23.8 mg/dL (ref 7.0–26.0)
CO2: 27 mEq/L (ref 22–29)
Calcium: 9.2 mg/dL (ref 8.4–10.4)
Creatinine: 0.9 mg/dL (ref 0.6–1.1)
Glucose: 91 mg/dl (ref 70–99)
Total Bilirubin: 0.29 mg/dL (ref 0.20–1.20)

## 2012-09-15 LAB — MORPHOLOGY: PLT EST: ADEQUATE

## 2012-09-15 LAB — LACTATE DEHYDROGENASE (CC13): LDH: 242 U/L (ref 125–245)

## 2012-09-15 LAB — CHCC SMEAR

## 2012-09-15 NOTE — Progress Notes (Signed)
Hematology and Oncology Follow Up Visit  Heidi Pratt [id number] 1918-05-02 79 y.o. 09/15/2012 5:10 PM   Principle Diagnosis: Encounter Diagnoses  Name Primary?  . Pleural effusion, right Yes  . Multiple myeloma without mention of remission      Interim History:   Work in visit for this delightful lady who just turned 51 in May. She is followed here for low grade ovarian cancer status post unilateral oophorectomy and IgA  kappa multiple myeloma initially diagnosed in October 2012 and treated with limited oral chemotherapy with melphalan and prednisone. She has had stable disease off treatment. She presents with the indolent history of 3 months of progressive dyspnea with minimal exertion. A chest radiograph showed a large right pleural effusion. She underwent a thoracentesis procedure on 09/05/2012. Surprisingly, cytology showed presence of plasma cells which were CD 138 positive. She had immediate relief of her dyspnea following the thoracentesis and has remained stable from the respiratory point of view since that time. The only other major event that occurred last month was when  a large dog jumped on her when she was in a park, knocked her down, and she sustained two cervical spine fractures. She did not require surgery. She is wearing a soft collar today.  Medications: reviewed  Allergies:  Allergies  Allergen Reactions  . Amoxicillin Diarrhea  . Crab (Shellfish Allergy) Nausea And Vomiting    Review of Systems: Constitutional:   Mild chronic fatigue, appetite is good but weight is down 5 pounds compare with her April 28 visit. Respiratory: See above Cardiovascular: No chest pain, pressure, or palpitations  Gastrointestinal: Chronic abdominal distention, no pain Genito-Urinary: No vaginal bleeding or discharge Musculoskeletal: Chronic arthritis pain Neurologic: No headache or change in vision Skin: No rash or ecchymosis Remaining ROS negative.  Physical Exam: Blood  pressure 154/69, pulse 79, temperature 97.3 F (36.3 C), temperature source Oral, resp. rate 20, height 5\' 3"  (1.6 m), weight 125 lb 8 oz (56.926 kg). Wt Readings from Last 3 Encounters:  09/15/12 125 lb 8 oz (56.926 kg)  08/01/12 130 lb 11.2 oz (59.285 kg)  06/13/12 131 lb (59.421 kg)     General appearance: Thin Caucasian woman HENNT: Pharynx no erythema or exudate Lymph nodes: No adenopathy Breasts: Lungs: Clear to auscultation resonant to percussion Heart: Regular rhythm no murmur or gallop Abdomen: Soft, nontender, chronically distended, no masses, no organomegaly, no fluid wave Extremities: No edema, no calf tenderness, chronic advanced degenerative arthritis changes of her fingers with multiple Heberden's nodes Musculoskeletal: See above GU: Vascular: No cyanosis Neurologic: Mental status intact, motor strength 5 over 5, reflexes absent symmetric at the knees, 1+ symmetric at the biceps, sensation is intact to vibration over the fingertips by tuning fork exam Skin: No rash or ecchymosis  Lab Results: Lab Results  Component Value Date   WBC 5.9 09/15/2012   HGB 10.0* 09/15/2012   HCT 28.3* 09/15/2012   MCV 99.3 09/15/2012   PLT 216 09/15/2012     Chemistry      Component Value Date/Time   NA 135* 06/13/2012 1517   NA 137 10/05/2011 1122   K 4.4 06/13/2012 1517   K 4.5 10/05/2011 1122   CL 100 06/13/2012 1517   CL 101 10/05/2011 1122   CO2 25 06/13/2012 1517   CO2 24 10/05/2011 1122   BUN 23.6 06/13/2012 1517   BUN 21 10/05/2011 1122   CREATININE 0.8 06/13/2012 1517   CREATININE 0.83 10/05/2011 1122   CREATININE 0.72 12/26/2010 1039  Component Value Date/Time   CALCIUM 8.9 06/13/2012 1517   CALCIUM 9.1 10/05/2011 1122   ALKPHOS 73 06/13/2012 1517   ALKPHOS 68 10/05/2011 1122   AST 23 06/13/2012 1517   AST 21 10/05/2011 1122   ALT 18 06/13/2012 1517   ALT 16 10/05/2011 1122   BILITOT 0.25 06/13/2012 1517   BILITOT 0.3 10/05/2011 1122       Radiological Studies: Dg Chest 1  View  09/05/2012   *RADIOLOGY REPORT*  Clinical Data: Status post thoracentesis.  CHEST - 1 VIEW  Comparison: CT chest 07/15/2009 and plain films of the chest 09/01/2012.  Findings: No pneumothorax is identified after thoracentesis.  Right pleural effusion seen on the comparison plain films is markedly decreased.  The left lung is clear.  Heart size is enlarged.  IMPRESSION:  1.  Negative for pneumothorax after thoracentesis. 2.  Cardiomegaly.   Original Report Authenticated By: Holley Dexter, M.D.   Ct Head Wo Contrast  08/26/2012   *RADIOLOGY REPORT*  Clinical Data: Posterior headache, neck pain, history of a fall several weeks ago  CT HEAD WITHOUT CONTRAST  Technique:  Contiguous axial images were obtained from the base of the skull through the vertex without contrast.  Comparison: CT brain of 03/15/2010  Findings: The ventricular system is prominent, as are the cortical sulci, consistent with diffuse atrophy.  The septum is midline position.  No hemorrhage, mass lesion, or acute infarction is seen. Moderate small vessel ischemic change is noted throughout the periventricular white matter.  On bone window images, no calvarial abnormality is seen.  The paranasal sinuses that are visualized are clear.  IMPRESSION: Moderate small vessel ischemic change and atrophy.  No acute intracranial abnormality.   Original Report Authenticated By: Dwyane Dee, M.D.   Ct Cervical Spine Wo Contrast  08/26/2012   *RADIOLOGY REPORT*  Clinical Data: Neck pain.  Previous odontoid fracture. Recent fall hitting back of head.  CT CERVICAL SPINE WITHOUT CONTRAST  Technique:  Multidetector CT imaging of the cervical spine was performed. Multiplanar CT image reconstructions were also generated.  Comparison: CT cervical spine 03/15/2010.Plain films 08/26/2012.  Findings: There is evidence for an old type 2 odontoid fracture with nonunion.  Well corticated margins suggest this is fracture is chronic.  There is chronic fusion of the  anterior arch of C1 to the dens.  Marked pannus projects posteriorly without definite cervicomedullary compression.  New C2 fractures are identified.  There is a transverse fracture across the posterior lamina of C2 on the left, across the lateral mass on the left, and across the pedicle on the right, all involving C2.  It is possible this could be unstable, given the bilateral nature, involvement of the right C2 pedicle, and chronic odontoid nonunion.  Chronic changes of spondylosis at C3-4 and C4-5 with 2-3 mm facet mediated slip related to advanced facet arthropathy.  Severe disc space narrowing C5-6 and C6-7 is stable.  Moderate vascular calcification.  Moderate sized right pleural effusion.  Mild thickening of the interlobular fissure on the left. No neck masses.  Compared with prior plain films, these fractures are not evident. Compared to prior chest x-ray, right effusion was not seen.  IMPRESSION: Chronic type 2 odontoid fracture appears similar to 2011.  New fractures involving the right pedicle of C2, as well as the left lamina and lateral mass of C2 (see images 15 - 18, series 3). These fractures could represent an unstable injury, a hangman's type variant, and neurosurgical consultation is warranted.  These  were not present in 2011.  Stable multilevel spondylosis C3-C7.  Moderate sized right pleural effusion, etiology unknown.  Consider two-view chest for further evaluation.  Findings discussed with ordering provider.   Original Report Authenticated By: Davonna Belling, M.D.   US Thoracentesis Asp Pleural Space W/img Guide  09/05/2012   *RADIOLOGY REPORT*  Clinical Data:  Patient with history of multiple myeloma, ovarian carcinoma 2009, recurrent right pleural effusion.  Request is made for diagnostic and therapeutic right thoracentesis.  ULTRASOUND GUIDED DIAGNOSTIC AND THERAPEUTIC RIGHT THORACENTESIS  An ultrasound guided thoracentesis was thoroughly discussed with the patient and questions answered.   The benefits, risks, alternatives and complications were also discussed.  The patient understands and wishes to proceed with the procedure.  Written consent was obtained.  Ultrasound was performed to localize and mark an adequate pocket of fluid in the right chest.  The area was then prepped and draped in the normal sterile fashion.  1% Lidocaine was used for local anesthesia.  Under ultrasound guidance a 19 gauge Yueh catheter was introduced.  Thoracentesis was performed.  The catheter was removed and a dressing applied.  Complications:  none  Findings: A total of approximately 630 cc's of slightly turbid, yellow fluid was removed. A fluid sample was sent for laboratory analysis.  IMPRESSION: Successful ultrasound guided diagnostic and therapeutic right thoracentesis yielding 630 c's of pleural fluid.  Read by: Jeananne Rama, P.A.-C   Original Report Authenticated By: Irish Lack, M.D.    Impression: #1. IgA multiple myeloma now with a malignant right pleural effusion consisting of plasma cells. Serum IgA level and kappa free light chains have been slowly rising over the last 6 months. However, it is unusual to see a pleural involvement with myeloma in patients who have been as stable as she has since her myeloma diagnosis.  Plan: I had a lengthy discussion with the patient and her physician son re treatment options. I strongly recommended she go back on treatment. She is nave to both immunomodulatory drugs and proteosome inhibitors. We discussed the potential efficacy and potential toxicities of these drugs. I am recommending a trial of Revlimid 10 mg 21 days on 7 days rest. Dose escalation as tolerated. She had poor tolerance of steroids in the past so I will not add steroids although they do potentiate the response of both the immunomodulatory drugs in the proteosome inhibitors. She and her son are aware that the pleural fluid may reaccumulate and that she may need subsequent thoracentesis  procedures until the Revlimid has a chance to work.  She has a planned vacation to the beach with her entire family within the next 2 weeks. I feel she is stable to travel and we will start the Revlimid upon her return. I will see her again in one month with a followup chest radiograph, laboratories, and exam.   CC:.    Levert Feinstein, MD 6/12/20145:10 PM

## 2012-09-19 ENCOUNTER — Telehealth: Payer: Self-pay | Admitting: *Deleted

## 2012-09-19 ENCOUNTER — Encounter: Payer: Self-pay | Admitting: *Deleted

## 2012-09-19 ENCOUNTER — Other Ambulatory Visit: Payer: Self-pay | Admitting: *Deleted

## 2012-09-19 LAB — IMMUNOFIXATION ELECTROPHORESIS
IgA: 984 mg/dL — ABNORMAL HIGH (ref 69–380)
IgM, Serum: 8 mg/dL — ABNORMAL LOW (ref 52–322)
Total Protein, Serum Electrophoresis: 6.5 g/dL (ref 6.0–8.3)

## 2012-09-19 LAB — KAPPA/LAMBDA LIGHT CHAINS: Kappa:Lambda Ratio: 10.61 — ABNORMAL HIGH (ref 0.26–1.65)

## 2012-09-19 NOTE — Telephone Encounter (Addendum)
Received call from pt & she was informed that we need to schedule her for a chemo education class & to sign a consent & we will also give her some pt information on the revlimid & a copy of the forms that she filled out.  Informed that script was sent to [facility] & they should contact her for delivery.  She will also need to do a survey.  She reports that she will be out of town leaving this sun 6/22 & back home 10/02/12 & would rather wait for class after she is back.  Message to Rose/Scheduler.  Received message from [facility] that script was sent to [facility].

## 2012-09-19 NOTE — Progress Notes (Signed)
RECEIVED A FAX FROM [facility] CONCERNING A NOTICE OF PRESCRIPTION TRANSFER TO [facility]. THIS NOTE WAS GIVEN TO DR.GRANFORTUNA'S NURSE, MYRTLE HARDIN,RN.

## 2012-09-19 NOTE — Telephone Encounter (Signed)
No answer.Was returning call

## 2012-09-21 ENCOUNTER — Telehealth: Payer: Self-pay | Admitting: *Deleted

## 2012-09-21 NOTE — Telephone Encounter (Signed)
Patient called in to notify us that the Revlimid will not be able to be covered through her [employer], but should be her [employer]. Called to [facility] at [phone], they have rec'd prescription and it is being processed and will be in touch with patient in next 24hours for insurance and delivery.

## 2012-09-22 ENCOUNTER — Encounter: Payer: Self-pay | Admitting: Oncology

## 2012-09-22 NOTE — Progress Notes (Signed)
[facility], [id number], approved revlimid 10mg  from 06/24/12-09/22/13.

## 2012-09-23 ENCOUNTER — Telehealth: Payer: Self-pay | Admitting: *Deleted

## 2012-09-23 NOTE — Telephone Encounter (Signed)
Informed pt that Revlimid script will be through [facility] and they will deliver to her home.  Pt stated she already has talked with [facility] and they will deliver medication on 10/04/12.  "I'm going to enjoy going out of town to see my great grand-children this weekend"  Pt confirmed appt for lab 10/11/12 and MD 10/18/12.  Pt verbalized understanding to call if any problems.

## 2012-10-11 ENCOUNTER — Other Ambulatory Visit (HOSPITAL_BASED_OUTPATIENT_CLINIC_OR_DEPARTMENT_OTHER): Payer: Medicare Other

## 2012-10-11 ENCOUNTER — Ambulatory Visit (HOSPITAL_COMMUNITY)
Admission: RE | Admit: 2012-10-11 | Discharge: 2012-10-11 | Disposition: A | Discharge status: Home / Self Care | Payer: Medicare Other | Source: Ambulatory Visit | Attending: Oncology | Admitting: Oncology

## 2012-10-11 DIAGNOSIS — I700 Atherosclerosis of aorta: Secondary | ICD-10-CM | POA: Insufficient documentation

## 2012-10-11 DIAGNOSIS — C9000 Multiple myeloma not having achieved remission: Secondary | ICD-10-CM

## 2012-10-11 DIAGNOSIS — M5030 Other cervical disc degeneration, unspecified cervical region: Secondary | ICD-10-CM | POA: Insufficient documentation

## 2012-10-11 DIAGNOSIS — M4310 Spondylolisthesis, site unspecified: Secondary | ICD-10-CM | POA: Insufficient documentation

## 2012-10-11 DIAGNOSIS — I998 Other disorder of circulatory system: Secondary | ICD-10-CM | POA: Insufficient documentation

## 2012-10-11 DIAGNOSIS — C562 Malignant neoplasm of left ovary: Secondary | ICD-10-CM

## 2012-10-11 DIAGNOSIS — C569 Malignant neoplasm of unspecified ovary: Secondary | ICD-10-CM

## 2012-10-11 LAB — CBC WITH DIFFERENTIAL/PLATELET
Basophils Absolute: 0 10*3/uL (ref 0.0–0.1)
Eosinophils Absolute: 0.1 10*3/uL (ref 0.0–0.5)
HCT: 25.3 % — ABNORMAL LOW (ref 34.8–46.6)
HGB: 8.4 g/dL — ABNORMAL LOW (ref 11.6–15.9)
MONO#: 0.4 10*3/uL (ref 0.1–0.9)
NEUT#: 4 10*3/uL (ref 1.5–6.5)
NEUT%: 79.1 % — ABNORMAL HIGH (ref 38.4–76.8)
RDW: 17.4 % — ABNORMAL HIGH (ref 11.2–14.5)
WBC: 5.1 10*3/uL (ref 3.9–10.3)
lymph#: 0.5 10*3/uL — ABNORMAL LOW (ref 0.9–3.3)

## 2012-10-11 LAB — COMPREHENSIVE METABOLIC PANEL (CC13)
AST: 21 U/L (ref 5–34)
Albumin: 3.2 g/dL — ABNORMAL LOW (ref 3.5–5.0)
Alkaline Phosphatase: 68 U/L (ref 40–150)
Glucose: 86 mg/dl (ref 70–140)
Potassium: 4.4 mEq/L (ref 3.5–5.1)
Sodium: 131 mEq/L — ABNORMAL LOW (ref 136–145)
Total Bilirubin: 0.27 mg/dL (ref 0.20–1.20)
Total Protein: 6.3 g/dL — ABNORMAL LOW (ref 6.4–8.3)

## 2012-10-12 ENCOUNTER — Telehealth: Payer: Self-pay | Admitting: *Deleted

## 2012-10-12 LAB — KAPPA/LAMBDA LIGHT CHAINS
Kappa free light chain: 7.65 mg/dL — ABNORMAL HIGH (ref 0.33–1.94)
Kappa:Lambda Ratio: 8.41 — ABNORMAL HIGH (ref 0.26–1.65)
Lambda Free Lght Chn: 0.91 mg/dL (ref 0.57–2.63)

## 2012-10-12 NOTE — Telephone Encounter (Signed)
Message copied by Sabino Snipes on Wed Oct 12, 2012 12:47 PM ------      Message from: Levert Feinstein      Created: Tue Oct 11, 2012  5:35 PM       Call pt -- no myeloma bone changes on Xrays ------

## 2012-10-12 NOTE — Telephone Encounter (Signed)
Pt notified of hgb result & informed no need for transfusion at this time but to call if she has any chest pain or pressure which she denies at this time.  Also informed of good bone survey results per Dr Cyndie Chime.

## 2012-10-12 NOTE — Telephone Encounter (Signed)
See previous note

## 2012-10-12 NOTE — Telephone Encounter (Signed)
Message copied by Sabino Snipes on Wed Oct 12, 2012 12:41 PM ------      Message from: Levert Feinstein      Created: Tue Oct 11, 2012  5:34 PM       Call pt: hemoglobin down from 10 to 8.4 but no need for transfusion at this time.  Call us if any chest pain or pressure ------

## 2012-10-12 NOTE — Telephone Encounter (Signed)
Message copied by Sabino Snipes on Wed Oct 12, 2012 12:43 PM ------      Message from: Levert Feinstein      Created: Tue Oct 11, 2012  5:35 PM       Call pt -- no myeloma bone changes on Xrays ------

## 2012-10-17 ENCOUNTER — Other Ambulatory Visit: Payer: Self-pay | Admitting: Oncology

## 2012-10-18 ENCOUNTER — Ambulatory Visit (HOSPITAL_COMMUNITY)
Admission: RE | Admit: 2012-10-18 | Discharge: 2012-10-18 | Disposition: A | Discharge status: Home / Self Care | Payer: Medicare Other | Source: Ambulatory Visit | Attending: Oncology | Admitting: Oncology

## 2012-10-18 ENCOUNTER — Telehealth: Payer: Self-pay | Admitting: Oncology

## 2012-10-18 ENCOUNTER — Ambulatory Visit (HOSPITAL_BASED_OUTPATIENT_CLINIC_OR_DEPARTMENT_OTHER): Payer: Medicare Other | Admitting: Oncology

## 2012-10-18 ENCOUNTER — Telehealth: Payer: Self-pay | Admitting: *Deleted

## 2012-10-18 VITALS — BP 140/69 | HR 86 | Temp 96.9°F | Resp 20 | Ht 63.0 in | Wt 126.6 lb

## 2012-10-18 DIAGNOSIS — C562 Malignant neoplasm of left ovary: Secondary | ICD-10-CM

## 2012-10-18 DIAGNOSIS — C9000 Multiple myeloma not having achieved remission: Secondary | ICD-10-CM

## 2012-10-18 DIAGNOSIS — J90 Pleural effusion, not elsewhere classified: Secondary | ICD-10-CM

## 2012-10-18 DIAGNOSIS — C569 Malignant neoplasm of unspecified ovary: Secondary | ICD-10-CM

## 2012-10-18 NOTE — Telephone Encounter (Signed)
Patient calls.  She is coming today at 4pm She has been experiencing SOB.  Her son suggested a CXR before she sees Dr. Cyndie Chime.  Discussed with Dr. Reece Agar.  He will put order in. Called patient and told her to get CXR at 3pm at [facility] and then see Dr.Granfortuna.  She appreciated the call back.

## 2012-10-18 NOTE — Telephone Encounter (Signed)
Gave pt appt for lab and MD for 10/24/12

## 2012-10-23 NOTE — Progress Notes (Signed)
Hematology and Oncology Follow Up Visit  Heidi Pratt [id number] 1918-08-01 79 y.o. 10/23/2012 1:10 PM   Principle Diagnosis: Encounter Diagnoses  Name Primary?  . Pleural effusion, right   . Multiple myeloma without mention of remission Yes  . Ovarian epithelial cancer, left      Interim History:   Short interim followup visit for this delightful 79 year old woman with IgA kappa Multiple myeloma. In early June of this year she presented with dyspnea with findings of a large right pleural effusion which unexpectedly showed plasma cells on cytology. She underwent thoracentesis on June 2. We discussed a new treatment plan. She was started on Revlimid 10 mg by mouth daily 21 days on 7 days rest without any steroids given poor tolerance to steroids in the past. She took a vacation to the beach with her family and just started the Revlimid 2 weeks ago. She had a bad day yesterday with excessive fatigue but it is not clear that this is due to the Revlimid. She had some intermittent dyspnea and we checked a chest radiograph today which I personally reviewed. There has been some reaccumulation of right pleural fluid but only to a low degree. She denies any orthopnea or PND.  Medications: reviewed  Allergies:  Allergies  Allergen Reactions  . Amoxicillin Diarrhea  . Crab (Shellfish Allergy) Nausea And Vomiting    Review of Systems: Constitutional:   See above Respiratory: See above Cardiovascular:  No chest pain or palpitations Gastrointestinal: No abdominal pain. Some low-grade nausea. Genito-Urinary: No urinary tract symptoms Musculoskeletal: Chronic arthritis pain Neurologic: No headache or change in vision Skin: No rash or ecchymosis Remaining ROS negative.  Physical Exam: Blood pressure 140/69, pulse 86, temperature 96.9 F (36.1 C), temperature source Oral, resp. rate 20, height 5\' 3"  (1.6 m), weight 126 lb 9.6 oz (57.425 kg). Wt Readings from Last 3 Encounters:  10/18/12  126 lb 9.6 oz (57.425 kg)  09/15/12 125 lb 8 oz (56.926 kg)  08/01/12 130 lb 11.2 oz (59.285 kg)     General appearance: Petite Caucasian woman, no distress HENNT: Pharynx no erythema or exudate Lymph nodes: No adenopathy Breasts: Lungs: Clear to auscultation resonant to percussion with air movement heard to the base over the right hemithorax as well as the left Heart: Regular rhythm Abdomen: Soft nontender Extremities: No edema Musculoskeletal: Advanced osteoarthritic changes of her fingers GU: Vascular: No cyanosis Neurologic: Skin:  Lab Results: Lab Results  Component Value Date   WBC 5.1 10/11/2012   HGB 8.4* 10/11/2012   HCT 25.3* 10/11/2012   MCV 98.8 10/11/2012   PLT 163 10/11/2012     Chemistry      Component Value Date/Time   NA 131* 10/11/2012 1036   NA 137 10/05/2011 1122   K 4.4 10/11/2012 1036   K 4.5 10/05/2011 1122   CL 98 09/15/2012 1143   CL 101 10/05/2011 1122   CO2 26 10/11/2012 1036   CO2 24 10/05/2011 1122   BUN 16.7 10/11/2012 1036   BUN 21 10/05/2011 1122   CREATININE 0.7 10/11/2012 1036   CREATININE 0.83 10/05/2011 1122   CREATININE 0.72 12/26/2010 1039      Component Value Date/Time   CALCIUM 8.8 10/11/2012 1036   CALCIUM 9.1 10/05/2011 1122   ALKPHOS 68 10/11/2012 1036   ALKPHOS 68 10/05/2011 1122   AST 21 10/11/2012 1036   AST 21 10/05/2011 1122   ALT 19 10/11/2012 1036   ALT 16 10/05/2011 1122   BILITOT 0.27 10/11/2012 1036  BILITOT 0.3 10/05/2011 1122       Radiological Studies: Dg Chest 2 View  10/18/2012   *RADIOLOGY REPORT*  Clinical Data: 79 year old female with pleural effusion.  History multiple myeloma.  CHEST - 2 VIEW  Comparison: 09/05/2012.  Findings: Small right pleural effusion.  No definite left pleural fluid.  Associated mild right lung base atelectasis.  Stable eventration of the left hemidiaphragm.  The left lung is grossly clear. Stable cardiomegaly and mediastinal contours.  No pneumothorax or edema. Stable visualized osseous structures.  IMPRESSION: Small  right pleural effusion with mild atelectasis.   Original Report Authenticated By: Erskine Speed, M.D.   Dg Chest Right Decubitus  10/18/2012   *RADIOLOGY REPORT*  Clinical Data: 79 year old female with pleural effusion.  CHEST - RIGHT DECUBITUS  Comparison: Two-view study 1448 hours the same day.  Findings: Right side down lateral decubitus view demonstrates pleural fluid layering to a depth of up to 19 mm along the right chest wall.  Incidental calcified granuloma at the left costophrenic angle.  IMPRESSION: Mobile right pleural effusion layering up to 19 mm.   Original Report Authenticated By: Erskine Speed, M.D.   Dg Bone Survey Met  10/11/2012   *RADIOLOGY REPORT*  Clinical Data: Active multiple myeloma, follow-up  METASTATIC BONE SURVEY  Comparison: 12/23/2010, chest radiograph 09/05/2012  Findings: Degenerative disc and facet disease changes cervical spine. Mild diffuse osseous demineralization. Chronic anterolisthesis at C3-C4 and C4-C5. Old odontoid fracture. Thoracolumbar scoliosis dextroconvex in thoracic region and levoconvex in lumbar. Atherosclerotic calcification aorta. Left hip prosthesis. Pessary in pelvis. Degenerative changes of right hip joint. Numerous pelvic phleboliths. Degenerative changes bilateral shoulder joints. Enlargement of cardiac silhouette. Blunting of right costophrenic angle question pleural effusion. Sclerosis identified at a lateral right mid rib approximately 5th, unchanged. No definite focal lytic lesions are identified to suggest myeloma.  IMPRESSION: No definite focal lytic lesions identified to suggest multiple myeloma. Osseous demineralization with degenerative disc and facet disease changes of the spine and scattered degenerative changes of the joints. Sclerosis at old right fifth rib fracture. Question small right pleural effusion. Old odontoid fracture.   Original Report Authenticated By: Ulyses Southward, M.D.    Impression: #1. IgA kappa Multiple myeloma She has had an  unexplained fall in her hemoglobin. If anything, Revlimid should stimulate red blood cell production. It is much more common to see leukopenia and/or mild thrombocytopenia on this drug. She's only taken it for 2 weeks. Current hemoglobin may be spurious. She denies any signs of any active bleeding. We discussed blood transfusion today. She would like to wait. I have scheduled a short interim followup visit for one week. She will go ahead and continue the third week of the first cycle of Revlimid and then she gets a 1 week rest period . Her son will be traveling to [place] and we will not initiate the next cycle until he returns.  #2. Right pleural effusion containing plasma cells Slowly reaccumulating. We'll continue to monitor closely. It usually takes about 2 months to see a response to the Revlimid. She may need another interim thoracentesis if she becomes symptomatic.  #3. Early stage ovarian cancer status post unilateral oophorectomy Her CA 125 levels have been elevated but I think this is nonspecific and more likely related to activity of her myeloma. Physical exam and pelvic ultrasounds have been unrevealing for any obvious recurrence with most recent study done 06/27/2012.  #4. Advanced degenerative arthritis     CC:.    Levert Feinstein,  MD 7/20/20141:10 PM

## 2012-10-24 ENCOUNTER — Other Ambulatory Visit: Payer: Self-pay | Admitting: *Deleted

## 2012-10-24 ENCOUNTER — Ambulatory Visit (HOSPITAL_BASED_OUTPATIENT_CLINIC_OR_DEPARTMENT_OTHER): Payer: Medicare Other | Admitting: Lab

## 2012-10-24 ENCOUNTER — Ambulatory Visit (HOSPITAL_COMMUNITY)
Admission: RE | Admit: 2012-10-24 | Discharge: 2012-10-24 | Disposition: A | Discharge status: Home / Self Care | Payer: Medicare Other | Source: Ambulatory Visit | Attending: Oncology | Admitting: Oncology

## 2012-10-24 ENCOUNTER — Encounter: Payer: Self-pay | Admitting: Oncology

## 2012-10-24 ENCOUNTER — Ambulatory Visit (HOSPITAL_BASED_OUTPATIENT_CLINIC_OR_DEPARTMENT_OTHER): Payer: Medicare Other | Admitting: Oncology

## 2012-10-24 ENCOUNTER — Encounter: Payer: Self-pay | Admitting: Pharmacist

## 2012-10-24 ENCOUNTER — Telehealth: Payer: Self-pay | Admitting: Oncology

## 2012-10-24 VITALS — BP 176/86 | HR 73 | Temp 97.7°F | Resp 19 | Ht 63.0 in | Wt 127.6 lb

## 2012-10-24 DIAGNOSIS — M7989 Other specified soft tissue disorders: Secondary | ICD-10-CM

## 2012-10-24 DIAGNOSIS — I824Y2 Acute embolism and thrombosis of unspecified deep veins of left proximal lower extremity: Secondary | ICD-10-CM

## 2012-10-24 DIAGNOSIS — C9000 Multiple myeloma not having achieved remission: Secondary | ICD-10-CM

## 2012-10-24 DIAGNOSIS — Z79899 Other long term (current) drug therapy: Secondary | ICD-10-CM | POA: Insufficient documentation

## 2012-10-24 DIAGNOSIS — I824Y9 Acute embolism and thrombosis of unspecified deep veins of unspecified proximal lower extremity: Secondary | ICD-10-CM

## 2012-10-24 DIAGNOSIS — I82402 Acute embolism and thrombosis of unspecified deep veins of left lower extremity: Secondary | ICD-10-CM

## 2012-10-24 DIAGNOSIS — I824Z9 Acute embolism and thrombosis of unspecified deep veins of unspecified distal lower extremity: Secondary | ICD-10-CM | POA: Insufficient documentation

## 2012-10-24 DIAGNOSIS — J90 Pleural effusion, not elsewhere classified: Secondary | ICD-10-CM

## 2012-10-24 HISTORY — DX: Other specified soft tissue disorders: M79.89

## 2012-10-24 LAB — CBC WITH DIFFERENTIAL/PLATELET
Basophils Absolute: 0 10*3/uL (ref 0.0–0.1)
EOS%: 1.9 % (ref 0.0–7.0)
HCT: 26 % — ABNORMAL LOW (ref 34.8–46.6)
HGB: 8.8 g/dL — ABNORMAL LOW (ref 11.6–15.9)
MCH: 33.8 pg (ref 25.1–34.0)
MCV: 99.4 fL (ref 79.5–101.0)
MONO%: 11.9 % (ref 0.0–14.0)
NEUT%: 76.1 % (ref 38.4–76.8)
RDW: 17.4 % — ABNORMAL HIGH (ref 11.2–14.5)

## 2012-10-24 MED ORDER — ENOXAPARIN SODIUM 100 MG/ML ~~LOC~~ SOLN
90.0000 mg | Freq: Once | SUBCUTANEOUS | Status: AC
  Administered 2012-10-24: 90 mg via SUBCUTANEOUS
  Filled 2012-10-24: qty 1

## 2012-10-24 MED ORDER — LENALIDOMIDE 10 MG PO CAPS: 10.0000 mg | ORAL_CAPSULE | Freq: Every day | ORAL | Status: DC

## 2012-10-24 MED ORDER — WARFARIN SODIUM 3 MG PO TABS: 3.0000 mg | ORAL_TABLET | Freq: Every day | ORAL | Status: DC

## 2012-10-24 MED ORDER — ENOXAPARIN SODIUM 100 MG/ML ~~LOC~~ SOLN: 90.0000 mg | Freq: Every day | SUBCUTANEOUS | Status: DC

## 2012-10-24 NOTE — Progress Notes (Signed)
*  PRELIMINARY RESULTS* Vascular Ultrasound Left lower extremity venous duplex has been completed.  Preliminary findings: Left =  Difficult to visualize calf veins, however there appears to be DVT involving a small segment of the mid peroneal veins.  Called report to Dr. Cyndie Chime. Instructed patient to return to cancer center.      Farrel Demark, RDMS, RVT  10/24/2012, 11:22 AM

## 2012-10-24 NOTE — Telephone Encounter (Signed)
gv and printed appt sched and avs for pt  °

## 2012-10-24 NOTE — Telephone Encounter (Signed)
THIS REFILL REQUEST FOR REVLIMID WAS PLACED IN DR.GRANFORTUNA'S ACTIVE WORK BOX. 

## 2012-10-24 NOTE — Telephone Encounter (Signed)
Gave pt appt for lab,inj and MD for August 2014

## 2012-10-24 NOTE — Patient Instructions (Signed)
OK to delay start of next cycle of Revlimid until Monday, August 11. Begin 1 baby aspirin daily  81 mg to prevent blood clots Lab every 2 weeks next  8/4 Next appointment with Dr Reece Agar at 10:30 AM, Friday  8/29 Chest X-Ray at [facility] before MD visit

## 2012-10-24 NOTE — Progress Notes (Unsigned)
Patient diagnosed with new L leg DVT after being on Revlimid for 3 weeks. She was on Coumadin in the past for 6 weeks duration after a hip/knee procedure.  She and her son note that it was difficult to get her INR regulated during that time, but that there were no bleeding complications.  Today, 10/24/12, she is to start Lovenox/Coumadin bridge.  She received her first Lovenox injection while here in the office.  She is on Lovenox 1.5mg /kg/day (90 mg) and is to start Coumadin 3mg  daily.  She is a resident of [facility] (phone: [phone]). She will have her lab draws, INR checks done at [facility] and we will follow up with Ms. Coultas for her dose adjustments and future labs.   Patient is to remain on Coumadin indefinitely if she remains on Revlimid therapy.  If she discontinues Revlimid, she should have 3 months of Coumadin therapy, per Dr. Cyndie Chime.  Spoke with [employer] at [facility].  I will fax standing orders for INR checks to [facility] (#: [phone]). Her next INR check will be on Friday, July 25 at [facility].  Lillia Pauls, PharmD Clinical Pharmacist 10/24/2012 1:17 PM

## 2012-10-24 NOTE — Addendum Note (Signed)
Addended by: Wandalee Ferdinand on: 10/24/2012 04:21 PM   Modules accepted: Orders

## 2012-10-24 NOTE — Progress Notes (Signed)
Hematology and Oncology Follow Up Visit  Heidi Pratt [id number] 11-28-1918 79 y.o. 10/24/2012 8:02 PM   Principle Diagnosis: Encounter Diagnoses  Name Primary?  . Multiple myeloma without mention of remission   . Pleural effusion, right   . Calf swelling   . DVT, lower extremity, proximal, acute, left Yes     Interim History:   1 week interval visit for this 79 year old woman recently started on Revlimid for progressive IgA kappa multiple myeloma with first sign of progression being a plasmacytic right pleural effusion status post right thoracentesis on 09/05/2012. I started her on a trial of Revlimid 10 mg by mouth 3 weeks on one week rest.  At time of her visit here last week she had completed the first 14 days. The medication was causing significant fatigue but she elected to continue the full course. She is now on a one-week holiday. Followup chest radiograph last week showed early recurrence of the right pleural effusion. She reported no dyspnea at rest. No PND or orthopnea. She is still sleeping on just one pillow. Hemoglobin was 8.4 at time of visit last week without any fall in her white count or platelet. She is back today to see if there has been further change in her hemoglobin and for possible transfusion. Hemoglobin is actually a little bit higher at 8.8. However she pointed out a new problem. She used some kind of a beauty wash or scrub on her lower extremities. She has now developed an asymmetric edema and streaky erythema over the left lower extremity in the shin area.  Medications: reviewed  Allergies:  Allergies  Allergen Reactions  . Amoxicillin Diarrhea  . Crab (Shellfish Allergy) Nausea And Vomiting    Review of Systems: Constitutional: A persistent fatigue   Respiratory: No dyspnea at rest. No orthopnea Cardiovascular:  No chest pain or palpitations Gastrointestinal: Not questioned Genito-Urinary: Not questioned Musculoskeletal: Chronic degenerative  arthritis pain Neurologic: No headache or change in vision Skin: See above Remaining ROS negative.  Physical Exam: Blood pressure 176/86, pulse 73, temperature 97.7 F (36.5 C), temperature source Oral, resp. rate 19, height 5\' 3"  (1.6 m), weight 127 lb 9.6 oz (57.879 kg). Wt Readings from Last 3 Encounters:  10/24/12 127 lb 9.6 oz (57.879 kg)  10/18/12 126 lb 9.6 oz (57.425 kg)  09/15/12 125 lb 8 oz (56.926 kg)     General appearance: A petite Caucasian woman, NAD HENNT: No erythema or exudate Lymph nodes: No adenopathy Breasts: Lungs: Decreased breath sounds and dull to percussion at the right base Heart: Regular rhythm Abdomen: Soft nontender Extremities: Asymmetric edema left greater than right. Left calf is firm. Both calves measured 32 cm. There is streaky erythema covering the left anterior shin Musculoskeletal: Advanced degenerative changes of her fingers with Heberden's nodes GU: Vascular: No cyanosis Neurologic: Grossly normal Skin: See extremity exam above  Lab Results: Lab Results  Component Value Date   WBC 4.0 10/24/2012   HGB 8.8* 10/24/2012   HCT 26.0* 10/24/2012   MCV 99.4 10/24/2012   PLT 182 10/24/2012     Chemistry      Component Value Date/Time   NA 131* 10/11/2012 1036   NA 137 10/05/2011 1122   K 4.4 10/11/2012 1036   K 4.5 10/05/2011 1122   CL 98 09/15/2012 1143   CL 101 10/05/2011 1122   CO2 26 10/11/2012 1036   CO2 24 10/05/2011 1122   BUN 16.7 10/11/2012 1036   BUN 21 10/05/2011 1122   CREATININE  0.7 10/11/2012 1036   CREATININE 0.83 10/05/2011 1122   CREATININE 0.72 12/26/2010 1039      Component Value Date/Time   CALCIUM 8.8 10/11/2012 1036   CALCIUM 9.1 10/05/2011 1122   ALKPHOS 68 10/11/2012 1036   ALKPHOS 68 10/05/2011 1122   AST 21 10/11/2012 1036   AST 21 10/05/2011 1122   ALT 19 10/11/2012 1036   ALT 16 10/05/2011 1122   BILITOT 0.27 10/11/2012 1036   BILITOT 0.3 10/05/2011 1122       Radiological Studies: Dg Chest 2 View  10/18/2012   *RADIOLOGY REPORT*   Clinical Data: 79 year old female with pleural effusion.  History multiple myeloma.  CHEST - 2 VIEW  Comparison: 09/05/2012.  Findings: Small right pleural effusion.  No definite left pleural fluid.  Associated mild right lung base atelectasis.  Stable eventration of the left hemidiaphragm.  The left lung is grossly clear. Stable cardiomegaly and mediastinal contours.  No pneumothorax or edema. Stable visualized osseous structures.  IMPRESSION: Small right pleural effusion with mild atelectasis.   Original Report Authenticated By: Erskine Speed, M.D.   Dg Chest Right Decubitus  10/18/2012   *RADIOLOGY REPORT*  Clinical Data: 79 year old female with pleural effusion.  CHEST - RIGHT DECUBITUS  Comparison: Two-view study 1448 hours the same day.  Findings: Right side down lateral decubitus view demonstrates pleural fluid layering to a depth of up to 19 mm along the right chest wall.  Incidental calcified granuloma at the left costophrenic angle.  IMPRESSION: Mobile right pleural effusion layering up to 19 mm.   Original Report Authenticated By: Erskine Speed, M.D.   Dg Bone Survey Met  10/11/2012   *RADIOLOGY REPORT*  Clinical Data: Active multiple myeloma, follow-up  METASTATIC BONE SURVEY  Comparison: 12/23/2010, chest radiograph 09/05/2012  Findings: Degenerative disc and facet disease changes cervical spine. Mild diffuse osseous demineralization. Chronic anterolisthesis at C3-C4 and C4-C5. Old odontoid fracture. Thoracolumbar scoliosis dextroconvex in thoracic region and levoconvex in lumbar. Atherosclerotic calcification aorta. Left hip prosthesis. Pessary in pelvis. Degenerative changes of right hip joint. Numerous pelvic phleboliths. Degenerative changes bilateral shoulder joints. Enlargement of cardiac silhouette. Blunting of right costophrenic angle question pleural effusion. Sclerosis identified at a lateral right mid rib approximately 5th, unchanged. No definite focal lytic lesions are identified to suggest  myeloma.  IMPRESSION: No definite focal lytic lesions identified to suggest multiple myeloma. Osseous demineralization with degenerative disc and facet disease changes of the spine and scattered degenerative changes of the joints. Sclerosis at old right fifth rib fracture. Question small right pleural effusion. Old odontoid fracture.   Original Report Authenticated By: Ulyses Southward, M.D.    Impression:  #1.IgA kappa multiple myeloma. After further discussion with her and her physician son, she agreed to take at least one more cycle of the Revlimid so that we can adequately assess response. She does not want to feel bad. I reassured her that if the drug is causing unacceptable side effects that we will modify it or stop it. Quality of life is most important to her. Her son will be out of the country for the next 2 weeks. I told her it would be okay to delay starting her next cycle until his return. We will start the next cycle on August 11.  #2. Asymmetric edema and erythematous changes left calf. I obtained a venous Doppler study and unfortunately the this study showed a deep vein thrombosis in the peroneal vein. I started her on low molecular weight heparin (Lovenox) 1.5  mg per kilogram equals 90 mg subcutaneous daily and Coumadin 3 mg by mouth daily. We will monitor her blood counts in my office. Her son informed me that she had to take Coumadin in the past after hip surgery and they had a hard time getting her up to a therapeutic dose. Still, at the age of 40, I want to take it slow. Fortunately they can administer the Lovenox at the facility where she resides. I inadvertently forgot to put her on thrombin prophylaxis with aspirin when I started the low-dose Revlimid. Revlimid is thrombogenic especially when given together with steroids. (She is not currently on steroids). I discussed this with her and her son today.         Levert Feinstein, MD 7/21/20148:02 PM

## 2012-10-25 ENCOUNTER — Telehealth: Payer: Self-pay | Admitting: Pharmacist

## 2012-10-25 ENCOUNTER — Other Ambulatory Visit: Payer: Self-pay | Admitting: *Deleted

## 2012-10-25 NOTE — Telephone Encounter (Signed)
Patient calls wondering if there is a problem with her taking iron and coumadin.  Let her know that it was ok.

## 2012-10-25 NOTE — Telephone Encounter (Signed)
[place], RN at [facility] called to clarify: Pt has Lovenox 7 syringes- Dr. Cyndie Chime did order this quantity yesterday w/ 2 refills. Is pt supposed to be on both the Lovenox & Coumadin- pt will be on both anticoagulants until the INR is therapeutic then the Lovenox will be discontinued & Coumadin continued. Ebony Hail, Pharm.D., CPP 10/25/2012@1 :52 PM

## 2012-10-28 ENCOUNTER — Ambulatory Visit: Payer: Self-pay | Admitting: Pharmacist

## 2012-10-28 DIAGNOSIS — I82402 Acute embolism and thrombosis of unspecified deep veins of left lower extremity: Secondary | ICD-10-CM

## 2012-10-28 NOTE — Progress Notes (Signed)
Spoke to Charity fundraiser at [facility] by telephone today. Per RN Malachi Paradise, patient has been taking 3 mg of Coumadin since Tuesday October 25, 2012.  Her INR is subtherapeutic today at 1.  She continues on Lovenox 90 mg daily.  We will increase patient's Coumadin dose to 4.5 mg (1.5 tablets) daily.  She will have her INR rechecked on 11/01/12 at [facility]  We will follow up on her results that day.  No charge encounter.  Lillia Pauls, PharmD Clinical Pharmacist 10/28/2012 5:31 PM

## 2012-11-01 ENCOUNTER — Telehealth: Payer: Self-pay | Admitting: *Deleted

## 2012-11-01 ENCOUNTER — Ambulatory Visit: Payer: Self-pay | Admitting: Pharmacist

## 2012-11-01 DIAGNOSIS — I82402 Acute embolism and thrombosis of unspecified deep veins of left lower extremity: Secondary | ICD-10-CM

## 2012-11-01 NOTE — Patient Instructions (Signed)
INR remains below goal  Continue lovenox injections  Increase coumadin to 2 tablets (6 mg) daily  Recheck INR next week next week 11/08/12 via blood draw at [facility]

## 2012-11-01 NOTE — Telephone Encounter (Signed)
Pt called requesting all labs to be drawn on Monday 11/07/12 instead of having lab on 8/4 and again on 11/08/12 for PT/INR.  Per Thayer Ohm, [facility] Ok for PT/INR to be drawn on 11/07/12.  Notified pt and [facility] Cherly Hensen, RN of this information.  Both verbalized understanding.

## 2012-11-01 NOTE — Progress Notes (Signed)
Spoke to Charity fundraiser at [facility] by telephone today. Per RN Malachi Paradise, patient has been taking 4.5 mg of Coumadin since Friday 10/28/12, except for 1 day where she only took 3mg  due to forgetting to take the extra half tablet. Her INR remains subtherapeutic today at 1.1. She continues on Lovenox 90 mg daily (2 refills remaining). No other complaints or issues noted We will increase patient's Coumadin dose to 6 mg (2 tablets) daily. She will have her INR rechecked on 11/08/12 at [facility] (RN requested trying blood draw INR check vs. Finger stick to verify results and the blood draws are done on Tuesdays or Thursdays. We will follow up on her results that day.    NO CHARGE encounter.

## 2012-11-07 ENCOUNTER — Telehealth: Payer: Self-pay | Admitting: Pharmacist

## 2012-11-07 ENCOUNTER — Other Ambulatory Visit (HOSPITAL_BASED_OUTPATIENT_CLINIC_OR_DEPARTMENT_OTHER): Payer: Medicare Other

## 2012-11-07 ENCOUNTER — Ambulatory Visit (HOSPITAL_BASED_OUTPATIENT_CLINIC_OR_DEPARTMENT_OTHER): Payer: Self-pay | Admitting: Pharmacist

## 2012-11-07 DIAGNOSIS — J90 Pleural effusion, not elsewhere classified: Secondary | ICD-10-CM

## 2012-11-07 DIAGNOSIS — C569 Malignant neoplasm of unspecified ovary: Secondary | ICD-10-CM

## 2012-11-07 DIAGNOSIS — I824Z9 Acute embolism and thrombosis of unspecified deep veins of unspecified distal lower extremity: Secondary | ICD-10-CM

## 2012-11-07 DIAGNOSIS — I82402 Acute embolism and thrombosis of unspecified deep veins of left lower extremity: Secondary | ICD-10-CM

## 2012-11-07 DIAGNOSIS — I82409 Acute embolism and thrombosis of unspecified deep veins of unspecified lower extremity: Secondary | ICD-10-CM

## 2012-11-07 DIAGNOSIS — C9000 Multiple myeloma not having achieved remission: Secondary | ICD-10-CM

## 2012-11-07 LAB — CBC WITH DIFFERENTIAL/PLATELET
Basophils Absolute: 0.1 10*3/uL (ref 0.0–0.1)
Eosinophils Absolute: 0.1 10*3/uL (ref 0.0–0.5)
HGB: 10.3 g/dL — ABNORMAL LOW (ref 11.6–15.9)
LYMPH%: 20.7 % (ref 14.0–49.7)
MCV: 97.7 fL (ref 79.5–101.0)
MONO%: 10.2 % (ref 0.0–14.0)
NEUT#: 2.4 10*3/uL (ref 1.5–6.5)
Platelets: 255 10*3/uL (ref 145–400)

## 2012-11-07 LAB — COMPREHENSIVE METABOLIC PANEL (CC13)
Albumin: 3.4 g/dL — ABNORMAL LOW (ref 3.5–5.0)
Alkaline Phosphatase: 90 U/L (ref 40–150)
BUN: 21.3 mg/dL (ref 7.0–26.0)
CO2: 24 mEq/L (ref 22–29)
Glucose: 88 mg/dl (ref 70–140)
Potassium: 4.6 mEq/L (ref 3.5–5.1)
Total Bilirubin: 0.23 mg/dL (ref 0.20–1.20)

## 2012-11-07 LAB — POCT INR: INR: 1.7

## 2012-11-07 LAB — PROTIME-INR

## 2012-11-07 NOTE — Patient Instructions (Signed)
Stop Lovenox shots and continue Coumadin 6 mg (2 tablets) daily.  Recheck INR on 11/15/12 at [facility].

## 2012-11-07 NOTE — Progress Notes (Signed)
Spoke with Cherly Hensen Banker) at [facility]. INR=2.1 via venous puncture (RN preference) Stop Lovenox shots and continue Coumadin 6 mg (2 tablets) daily.   Recheck INR on 11/15/12 at [facility] via venous puncture (RN preference) Cherly Hensen RN [phone], fax [phone] Faxed instructions and new orders to RN above.

## 2012-11-07 NOTE — Telephone Encounter (Signed)
Called patient to let her know we will stop lovenox shots and continue with coumadin 6mg  tablets (2 tablets) each day She will be checked next Tue at [facility] and we will call her when we get the results.

## 2012-11-09 LAB — IMMUNOFIXATION ELECTROPHORESIS
IgA: 894 mg/dL — ABNORMAL HIGH (ref 69–380)
IgG (Immunoglobin G), Serum: 608 mg/dL — ABNORMAL LOW (ref 690–1700)
IgM, Serum: 11 mg/dL — ABNORMAL LOW (ref 52–322)
Total Protein, Serum Electrophoresis: 6.8 g/dL (ref 6.0–8.3)

## 2012-11-10 ENCOUNTER — Telehealth: Payer: Self-pay | Admitting: *Deleted

## 2012-11-10 ENCOUNTER — Other Ambulatory Visit: Payer: Self-pay | Admitting: *Deleted

## 2012-11-10 MED ORDER — AZITHROMYCIN 250 MG PO TABS: ORAL_TABLET | ORAL | Status: DC

## 2012-11-10 NOTE — Telephone Encounter (Signed)
Notified pt that antibody level is decreasing.  Pt verbalized understanding and expressed appreciation for call.

## 2012-11-10 NOTE — Telephone Encounter (Signed)
Pt called asking "I've gotten a cold and since Dr. Cyndie Chime knows my whole history; I want to know what he thinks I should do?"  Pt reports congestion started yesterday "I'm worried--I don't want it to get to my chest"  Pt. denies fever, SOB, N/V.  Pt is eating OK "don't have much of an appetite"  Is taking in fluids well.  Pt did take Claritin and reports it did not help.  Instructed pt to push fluids and eat as much as she can; rest; monitor temp and report any fevers.  "Really want to know what Dr. Cyndie Chime thinks I should do"  Note to MD.

## 2012-11-10 NOTE — Telephone Encounter (Signed)
Spoke with pt; per Dr. Cyndie Chime will send in Z-pack for her to help prevent any infections and to call if any further problems.  Pt verbalized understanding and expressed appreciation for call.

## 2012-11-10 NOTE — Telephone Encounter (Signed)
Message copied by Gala Romney on Thu Nov 10, 2012  3:49 PM ------      Message from: Levert Feinstein      Created: Wed Nov 09, 2012  4:58 PM       Call pt - trend for decrease in antibody level ------

## 2012-11-11 ENCOUNTER — Other Ambulatory Visit: Payer: Self-pay | Admitting: *Deleted

## 2012-11-11 DIAGNOSIS — I82402 Acute embolism and thrombosis of unspecified deep veins of left lower extremity: Secondary | ICD-10-CM

## 2012-11-11 MED ORDER — WARFARIN SODIUM 6 MG PO TABS: 6.0000 mg | ORAL_TABLET | Freq: Every day | ORAL | Status: DC

## 2012-11-14 ENCOUNTER — Other Ambulatory Visit: Payer: Self-pay | Admitting: Neurological Surgery

## 2012-11-14 DIAGNOSIS — S129XXA Fracture of neck, unspecified, initial encounter: Secondary | ICD-10-CM

## 2012-11-14 NOTE — Telephone Encounter (Signed)
Notified pt per Dr. Cyndie Chime that there are no good treatments for virus infections & he suggested to just treat symptoms with cough med & antihistamine.  Encouraged to check with her pharmacist for any interactions with her meds.  She was in agreement with this & knows she can call back if needed.

## 2012-11-14 NOTE — Telephone Encounter (Signed)
Received vm call from pt @ 9:51 am stating that she has finished her antibiotics & that he cold has gone to her chest.  She reports congestion with non-productive cough but no elevated temp & wants to know what she should do.  Note to Dr Cyndie Chime.

## 2012-11-15 ENCOUNTER — Ambulatory Visit: Payer: Self-pay | Admitting: Pharmacist

## 2012-11-15 DIAGNOSIS — I82402 Acute embolism and thrombosis of unspecified deep veins of left lower extremity: Secondary | ICD-10-CM

## 2012-11-15 LAB — POCT INR: INR: 2.79

## 2012-11-15 NOTE — Progress Notes (Signed)
Spoke with Cherly Hensen Banker) at [facility] on the phone (NO CHARGE).  INR=2.79 via venous puncture (RN preference)  continue Coumadin 6 mg (2 tablets) daily.  [facility]12 [facility] [phone], fax [phone]  Faxed instructions and new orders to RN above.

## 2012-11-15 NOTE — Patient Instructions (Addendum)
Spoke with Cherly Hensen Banker) at [facility].  INR=2.79 via venous puncture (RN preference)  continue Coumadin 6 mg (2 tablets) daily.  Recheck IN[facility] on 11/[facility]/12 at [facility] at 11:30am [facility] [phone], fax [phone]  Faxed instructions and new orders to RN above.

## 2012-11-21 ENCOUNTER — Other Ambulatory Visit: Payer: Self-pay | Admitting: *Deleted

## 2012-11-21 ENCOUNTER — Other Ambulatory Visit (HOSPITAL_BASED_OUTPATIENT_CLINIC_OR_DEPARTMENT_OTHER): Payer: Medicare Other | Admitting: Lab

## 2012-11-21 ENCOUNTER — Ambulatory Visit: Payer: Self-pay | Admitting: Pharmacist

## 2012-11-21 DIAGNOSIS — C9000 Multiple myeloma not having achieved remission: Secondary | ICD-10-CM

## 2012-11-21 DIAGNOSIS — J90 Pleural effusion, not elsewhere classified: Secondary | ICD-10-CM

## 2012-11-21 DIAGNOSIS — I82402 Acute embolism and thrombosis of unspecified deep veins of left lower extremity: Secondary | ICD-10-CM

## 2012-11-21 DIAGNOSIS — I82409 Acute embolism and thrombosis of unspecified deep veins of unspecified lower extremity: Secondary | ICD-10-CM

## 2012-11-21 LAB — CBC WITH DIFFERENTIAL/PLATELET
BASO%: 0.3 % (ref 0.0–2.0)
EOS%: 1.4 % (ref 0.0–7.0)
HCT: 31.1 % — ABNORMAL LOW (ref 34.8–46.6)
MCHC: 33.1 g/dL (ref 31.5–36.0)
MONO#: 0.6 10*3/uL (ref 0.1–0.9)
NEUT%: 71.4 % (ref 38.4–76.8)
RDW: 16.1 % — ABNORMAL HIGH (ref 11.2–14.5)
WBC: 6.6 10*3/uL (ref 3.9–10.3)
lymph#: 1.2 10*3/uL (ref 0.9–3.3)

## 2012-11-21 LAB — PROTIME-INR
INR: 2.3 (ref 2.00–3.50)
Protime: 27.6 Seconds — ABNORMAL HIGH (ref 10.6–13.4)

## 2012-11-21 NOTE — Patient Instructions (Signed)
Continue Coumadin 6 mg (1 teal-colored tablet) daily.  Recheck INR on 12/01/12;  lab appointment at 10:30am.   You do not need to stay for the coumadin clinic visit. The pharmacist will call you with your INR results.

## 2012-11-21 NOTE — Telephone Encounter (Signed)
THIS REFILL REQUEST FOR REVLIMID WAS PLACED IN DR.GRANFORTUNA'S ACTIVE WORK BOX. 

## 2012-11-21 NOTE — Progress Notes (Signed)
INR within goal today. No problems to report regarding anticoagulation. Only usual bruising. No bleeding. Pt missed one dose of coumadin on 11/18/12. Her appetite is sometimes decreased. No medication changes.  No other changes to report. Pt prefers to have her INR checked here at [facility] instead of [facility]. When she has her INR checked at [facility], she has to be there at 7am. She prefers to have her INR checked at [facility] mid-morning and then prefers the pharmacist call her with the results. She does not want to stay for a "face to face visit" as she feels she needs to reduce the number of visits that she has scheduled. Continue Coumadin 6 mg (1 teal-colored tablet) daily.  Recheck INR on 12/01/12;  lab appointment at 10:30am.   She will not stay for the coumadin clinic visit. The pharmacist will call pt with INR results. Unscheduled visit - no charge encounter.

## 2012-11-22 ENCOUNTER — Other Ambulatory Visit: Payer: Self-pay | Admitting: *Deleted

## 2012-11-22 DIAGNOSIS — C9000 Multiple myeloma not having achieved remission: Secondary | ICD-10-CM

## 2012-11-22 MED ORDER — LENALIDOMIDE 10 MG PO CAPS: 10.0000 mg | ORAL_CAPSULE | Freq: Every day | ORAL | Status: DC

## 2012-12-01 ENCOUNTER — Ambulatory Visit (HOSPITAL_BASED_OUTPATIENT_CLINIC_OR_DEPARTMENT_OTHER): Payer: Medicare Other | Admitting: Lab

## 2012-12-01 ENCOUNTER — Ambulatory Visit: Payer: Medicare Other

## 2012-12-01 ENCOUNTER — Telehealth: Payer: Self-pay | Admitting: Pharmacist

## 2012-12-01 ENCOUNTER — Other Ambulatory Visit: Payer: Self-pay | Admitting: Neurological Surgery

## 2012-12-01 ENCOUNTER — Ambulatory Visit
Admission: RE | Admit: 2012-12-01 | Discharge: 2012-12-01 | Disposition: A | Discharge status: Home / Self Care | Payer: Medicare Other | Source: Ambulatory Visit | Attending: Neurological Surgery | Admitting: Neurological Surgery

## 2012-12-01 DIAGNOSIS — I82402 Acute embolism and thrombosis of unspecified deep veins of left lower extremity: Secondary | ICD-10-CM

## 2012-12-01 DIAGNOSIS — IMO0002 Reserved for concepts with insufficient information to code with codable children: Secondary | ICD-10-CM

## 2012-12-01 DIAGNOSIS — I82409 Acute embolism and thrombosis of unspecified deep veins of unspecified lower extremity: Secondary | ICD-10-CM

## 2012-12-01 LAB — PROTIME-INR: Protime: 33.6 Seconds — ABNORMAL HIGH (ref 10.6–13.4)

## 2012-12-01 NOTE — Telephone Encounter (Signed)
Left message with pt to call us and set up next cc/lab appmt.   INR was 2.8 today Told her to stay on same dose, but call to set up appmt

## 2012-12-02 ENCOUNTER — Telehealth: Payer: Self-pay | Admitting: Oncology

## 2012-12-02 ENCOUNTER — Ambulatory Visit (HOSPITAL_COMMUNITY)
Admission: RE | Admit: 2012-12-02 | Discharge: 2012-12-02 | Disposition: A | Discharge status: Home / Self Care | Payer: Medicare Other | Source: Ambulatory Visit | Attending: Oncology | Admitting: Oncology

## 2012-12-02 ENCOUNTER — Ambulatory Visit (HOSPITAL_BASED_OUTPATIENT_CLINIC_OR_DEPARTMENT_OTHER): Payer: Medicare Other | Admitting: Oncology

## 2012-12-02 VITALS — BP 142/111 | HR 77 | Resp 18 | Ht 63.0 in | Wt 121.2 lb

## 2012-12-02 DIAGNOSIS — I10 Essential (primary) hypertension: Secondary | ICD-10-CM

## 2012-12-02 DIAGNOSIS — J910 Malignant pleural effusion: Secondary | ICD-10-CM | POA: Insufficient documentation

## 2012-12-02 DIAGNOSIS — I82402 Acute embolism and thrombosis of unspecified deep veins of left lower extremity: Secondary | ICD-10-CM

## 2012-12-02 DIAGNOSIS — C9000 Multiple myeloma not having achieved remission: Secondary | ICD-10-CM

## 2012-12-02 DIAGNOSIS — C562 Malignant neoplasm of left ovary: Secondary | ICD-10-CM

## 2012-12-02 DIAGNOSIS — I82409 Acute embolism and thrombosis of unspecified deep veins of unspecified lower extremity: Secondary | ICD-10-CM

## 2012-12-02 DIAGNOSIS — J90 Pleural effusion, not elsewhere classified: Secondary | ICD-10-CM

## 2012-12-02 DIAGNOSIS — Z8543 Personal history of malignant neoplasm of ovary: Secondary | ICD-10-CM

## 2012-12-02 NOTE — Progress Notes (Signed)
Hematology and Oncology Follow Up Visit  Heidi Pratt [id number] 1918/06/02 79 y.o. 12/02/2012 11:46 AM   Principle Diagnosis: Encounter Diagnoses  Name Primary?  . Multiple myeloma without mention of remission Yes  . Pleural effusion, right   . Ovarian epithelial cancer, left   . Benign essential HTN   . DVT (deep venous thrombosis), left      Interim History:   This delightful 79 year old woman with IgA kappa multiple myeloma diagnosed in November 2012. She received initial treatment with oral Alkeran and prednisone through April 2013. She never had a major response. She was given a treatment break. She had poor tolerance for the steroid component of the regimen which was stopped early. She recently had a progressive rise in her IgA and serum light chains and developed a symptomatic right pleural effusion which on thoracentesis showed plasma cells. She was started on a trial of  Revlimid 10 mg by mouth 3 weeks on one-week off in July 2014. She took one full cycle. Main side effect was fatigue. She contracted a bronchitis which was persistent in August. She received a course of azithromycin. She elected to stop her Revlimid.  She feels much better at this time. Cough has resolved. Breathing is better. No other interim problems. She is still wearing a cervical collar status post recent neck trauma after she was attacked by a dog in a local park.    Medications: reviewed  Allergies:  Allergies  Allergen Reactions  . Amoxicillin Diarrhea  . Crab [Shellfish Allergy] Nausea And Vomiting    Review of Systems: Constitutional:    improved constitutional symptoms  HEENT no sore throat Respiratory: resolved cough and dyspnea  Cardiovascular:   no chest pain  Gastrointestinal: no abdominal complaints  Genito-Urinary:  not questioned  Musculoskeletal: See above Neurologic: no headache or change in vision  Skin: no rash  Vascular: Hematology: Remaining ROS  negative.     Physical Exam: Blood pressure 142/111, pulse 77, resp. rate 18, height 5\' 3"  (1.6 m), weight 121 lb 3.2 oz (54.976 kg). Wt Readings from Last 3 Encounters:  12/02/12 121 lb 3.2 oz (54.976 kg)  10/24/12 127 lb 9.6 oz (57.879 kg)  10/18/12 126 lb 9.6 oz (57.425 kg)     General appearance:  thin Caucasian woman  HENNT:  pharynx no erythema or exudate. She is wearing a soft cervical collar Lymph nodes:  no adenopathy  Breasts: Lungs: now clear to auscultation and resonant to percussion  Heart: regular rhythm no murmur  Abdomen: soft, nontender, no mass, no organomegaly, no fluid wave. Extremities: no edema. Osteoarthritic changes of the hands.  Musculoskeletal: see above  GU: Vascular: No cyanosis Neurologic:motor strength 5 over 5, reflexes 1+ symmetric, sensation intact to vibration  Skin: no rash or ecchymosis   Lab Results: Lab Results  Component Value Date   WBC 6.6 11/21/2012   HGB 10.3* 11/21/2012   HCT 31.1* 11/21/2012   MCV 93.7 11/21/2012   PLT 245 11/21/2012     Chemistry      Component Value Date/Time   NA 133* 11/07/2012 1136   NA 137 10/05/2011 1122   K 4.6 11/07/2012 1136   K 4.5 10/05/2011 1122   CL 98 09/15/2012 1143   CL 101 10/05/2011 1122   CO2 24 11/07/2012 1136   CO2 24 10/05/2011 1122   BUN 21.3 11/07/2012 1136   BUN 21 10/05/2011 1122   CREATININE 0.8 11/07/2012 1136   CREATININE 0.83 10/05/2011 1122   CREATININE 0.72  12/26/2010 1039      Component Value Date/Time   CALCIUM 9.2 11/07/2012 1136   CALCIUM 9.1 10/05/2011 1122   ALKPHOS 90 11/07/2012 1136   ALKPHOS 68 10/05/2011 1122   AST 29 11/07/2012 1136   AST 21 10/05/2011 1122   ALT 41 11/07/2012 1136   ALT 16 10/05/2011 1122   BILITOT 0.23 11/07/2012 1136   BILITOT 0.3 10/05/2011 1122       Radiological Studies: Dg Chest 2 View  12/02/2012   *RADIOLOGY REPORT*  Clinical Data: Follow-up malignant effusion/ myeloma.  CHEST - 2 VIEW  Comparison: Multiple priors, most recent 10/18/2012  Findings: Cardiac  silhouette is unchanged. Improvement in right pleural effusion. No pneumothorax. No edema or infiltrate. Bones are unchanged in appearance.  IMPRESSION: Improvement in right pleural effusion.   Original Report Authenticated By: Jerene Dilling, M.D.   Ct Cervical Spine Wo Contrast  12/01/2012   *RADIOLOGY REPORT*  Clinical Data: Acute-on-chronic C2 fractures.  Most recent injury May 2014. Bilateral hand numbness.  No pain.  CT CERVICAL SPINE WITHOUT CONTRAST  Technique:  Multidetector CT imaging of the cervical spine was performed. Multiplanar CT image reconstructions were also generated.  Comparison: 08/26/2012 most recent, also 03/15/2010 CT.  Findings: Chronic type 2 odontoid fracture is redemonstrated. There is extensive pannus.  The margins are rounded and well corticated suggesting this is an old injury.  This appearance was noted on prior CT in 2011.  There is a prior MRI from 2006 which shows the odontoid to be fractured.  Bilateral C2 fractures described most recently in Aug 26, 2012 have not healed.  There is an ununited fracture of the right pedicle of C2.  There is an ununited ring fracture of the left posterior lamina of C2 which extends into the left facet.  There is severe joint space narrowing of the left C2-3 facet.  Difficult to appreciate on the previous CT, but probably present, are bilateral ring fractures of C1 posteriorly.  These are noted on image 14 series 2 of the current study.  The appearance is unchanged from May 8, in that there is no interval healing of these fractures either. The anterior arch of C1 is fused to the odontoid tip.  Multilevel spondylosis from C2-T1 is unchanged.  Severe disc space narrowing at C5-6 and C6-7 is present most notably.  IMPRESSION: Bilateral C2 fractures involve the right pedicle and left lamina/facet have not healed.  Ununited type 2 odontoid fracture appears unchanged from 2006.  Moderate pannus remains.  Difficult to appreciate in retrospect, but  probably present, are bilateral posterior arch fractures of C1.  These remain ununited as do the C2 pedicle and laminar arch fractures.   Original Report Authenticated By: Davonna Belling, M.D.   Dg Chest Right Decubitus  12/02/2012   *RADIOLOGY REPORT*  Clinical Data: Multiple myeloma, malignant effusion  CHEST - RIGHT DECUBITUS  Comparison: Concurrent AP/lateral chest radiographs dated 12/02/2012.  Decubitus radiographs dated 10/18/2012.  Findings: Tiny layering right pleural effusion, improved.  Lungs are essentially clear.  No pneumothorax.  Cardiomegaly.  IMPRESSION: Tiny layering right pleural effusion, improved.   Original Report Authenticated By: Charline Bills, M.D.    Impression:  #1. IgA kappa multiple myeloma She's only had one cycle of Revlimid and then skipped a month. She will go back on the drug again Monday, September 1. I told her it would take at least 2 cycles to see an effect. Most recent IgA level on August 4 was 608,  free light  chains 5. 95 mg percent and this compares with IgA 374 on June 12, light chains 10.5 on June 12 and 7.65 on July 8.  #2. Plasmacytic right pleural effusion. Initial thoracentesis. No reaccumulation at this time. Physical exam is normal. Chest radiograph today which I personally reviewed shows minimal blunting at the right costophrenic angle.  #3. Left lower extremity DVT related to Revlimid now on full dose Coumadin anticoagulation. INR today is 2.8.  #4. Stage I ovarian cancer treated with unilateral left oophorectomy She has had inconsistent elevations of CA 125 tumor marker with no clinical or radiographic evidence for recurrent ovarian cancer. We will continue to follow.    CC:.    Levert Feinstein, MD 8/29/201411:46 AM

## 2012-12-02 NOTE — Telephone Encounter (Signed)
gv pt appt schedule for September thru November.  °

## 2012-12-19 ENCOUNTER — Ambulatory Visit (HOSPITAL_BASED_OUTPATIENT_CLINIC_OR_DEPARTMENT_OTHER): Payer: Medicare Other | Admitting: Lab

## 2012-12-19 ENCOUNTER — Ambulatory Visit (HOSPITAL_BASED_OUTPATIENT_CLINIC_OR_DEPARTMENT_OTHER): Payer: Medicare Other | Admitting: Pharmacist

## 2012-12-19 DIAGNOSIS — C9000 Multiple myeloma not having achieved remission: Secondary | ICD-10-CM

## 2012-12-19 DIAGNOSIS — I82402 Acute embolism and thrombosis of unspecified deep veins of left lower extremity: Secondary | ICD-10-CM

## 2012-12-19 DIAGNOSIS — C562 Malignant neoplasm of left ovary: Secondary | ICD-10-CM

## 2012-12-19 DIAGNOSIS — I82409 Acute embolism and thrombosis of unspecified deep veins of unspecified lower extremity: Secondary | ICD-10-CM

## 2012-12-19 LAB — CBC WITH DIFFERENTIAL/PLATELET
Eosinophils Absolute: 0.1 10*3/uL (ref 0.0–0.5)
HCT: 26.6 % — ABNORMAL LOW (ref 34.8–46.6)
LYMPH%: 18.3 % (ref 14.0–49.7)
MCHC: 33.4 g/dL (ref 31.5–36.0)
MCV: 95.8 fL (ref 79.5–101.0)
MONO#: 0.6 10*3/uL (ref 0.1–0.9)
MONO%: 15.2 % — ABNORMAL HIGH (ref 0.0–14.0)
NEUT#: 2.4 10*3/uL (ref 1.5–6.5)
NEUT%: 64.2 % (ref 38.4–76.8)
Platelets: 181 10*3/uL (ref 145–400)
RBC: 2.78 10*6/uL — ABNORMAL LOW (ref 3.70–5.45)
WBC: 3.8 10*3/uL — ABNORMAL LOW (ref 3.9–10.3)

## 2012-12-19 LAB — PROTIME-INR: Protime: 54 Seconds — ABNORMAL HIGH (ref 10.6–13.4)

## 2012-12-19 LAB — COMPREHENSIVE METABOLIC PANEL (CC13)
Albumin: 3.1 g/dL — ABNORMAL LOW (ref 3.5–5.0)
BUN: 19.1 mg/dL (ref 7.0–26.0)
Calcium: 8.6 mg/dL (ref 8.4–10.4)
Chloride: 102 mEq/L (ref 98–109)
Creatinine: 0.8 mg/dL (ref 0.6–1.1)
Glucose: 89 mg/dl (ref 70–140)
Potassium: 4.3 mEq/L (ref 3.5–5.1)

## 2012-12-19 LAB — POCT INR: INR: 4.5

## 2012-12-19 MED ORDER — WARFARIN SODIUM 5 MG PO TABS: 5.0000 mg | ORAL_TABLET | Freq: Every day | ORAL | Status: DC

## 2012-12-19 NOTE — Patient Instructions (Signed)
Hold coumadin today (9/15) and tomorrow (9/16).  On 9/17, begin coumadin 5mg  daily (One peach-colored tablet daily).  Recheck INR in 1 week on 12/28/12; lab at 11:30am. The pharmacist will call you to discuss your results.

## 2012-12-19 NOTE — Progress Notes (Signed)
INR above goal today. No problems to report regarding anticoagulation. No changes in medications. No missed coumadin doses. Pt started having a glass of wine each night with dinner about 2 weeks ago. She talked to Dr. Cyndie Chime about this at her last visit. Usual bruising on her legs. The number of bruises on her legs is slightly more than usual (likely due to increased INR). Will hold coumadin today (9/15) and tomorrow (9/16).  On 9/17, begin coumadin 5mg  daily (One peach-colored tablet daily).  A prescription has been faxed to the [facility] on [address]. Pt does not like to split tablets and prefers to take the same strength tablet everyday, if possible. Recheck INR in 1 week on 12/28/12; lab at 11:30am. Pt does not stay for CC appointment. She prefers to be called to discuss her INR results. Pt has a clear understanding of tablet color/strength change and the above instructions. Spoke to pt by telephone - no charge encounter.

## 2012-12-20 ENCOUNTER — Telehealth: Payer: Self-pay | Admitting: *Deleted

## 2012-12-20 LAB — IGA: IgA: 737 mg/dL — ABNORMAL HIGH (ref 69–380)

## 2012-12-20 NOTE — Telephone Encounter (Signed)
Called pt to discuss outstanding auth for revlimid.  She reports that she had stopped the drug was back on it & will soon be on her break.  Encouraged to call [facility] & arrange for delivery for next cycle before expiration.  She was also given results of her IgA & CA125 per Dr Patsy Lager instructions.

## 2012-12-20 NOTE — Telephone Encounter (Signed)
Message copied by Sabino Snipes on Tue Dec 20, 2012  9:55 AM ------      Message from: Levert Feinstein      Created: Mon Dec 19, 2012  9:10 PM       Call pt: IgA antibody level now slowly falling on pomalyst ------

## 2012-12-20 NOTE — Telephone Encounter (Signed)
Called [facility] @ [phone] & requested refill on revlimid from script written in August & auth # [id number] which is about to expire 12/22/12.  They had a hold on script b/c of pt having side effects.  They will process script.

## 2012-12-28 ENCOUNTER — Ambulatory Visit (HOSPITAL_BASED_OUTPATIENT_CLINIC_OR_DEPARTMENT_OTHER): Payer: Medicare Other | Admitting: Pharmacist

## 2012-12-28 ENCOUNTER — Other Ambulatory Visit (HOSPITAL_BASED_OUTPATIENT_CLINIC_OR_DEPARTMENT_OTHER): Payer: Medicare Other

## 2012-12-28 DIAGNOSIS — I82409 Acute embolism and thrombosis of unspecified deep veins of unspecified lower extremity: Secondary | ICD-10-CM

## 2012-12-28 DIAGNOSIS — I82402 Acute embolism and thrombosis of unspecified deep veins of left lower extremity: Secondary | ICD-10-CM

## 2012-12-28 LAB — PROTIME-INR
INR: 1.9 — ABNORMAL LOW (ref 2.00–3.50)
Protime: 22.8 Seconds — ABNORMAL HIGH (ref 10.6–13.4)

## 2012-12-28 NOTE — Progress Notes (Signed)
INR = 1.9 after holding x 2 days then decreasing Coumadin to 5 mg daily She has no bruising. She is still drinking wine w/ dinner each evening. INR much improved.  Continue Coumadin 5 mg daily Already scheduled for lab on 01/02/13.  We will call her after INR result available. NO CHARGE- phone encounter. Ebony Hail, Pharm.D., CPP 12/28/2012@2 :07 PM

## 2013-01-02 ENCOUNTER — Other Ambulatory Visit (HOSPITAL_BASED_OUTPATIENT_CLINIC_OR_DEPARTMENT_OTHER): Payer: Medicare Other

## 2013-01-02 DIAGNOSIS — C569 Malignant neoplasm of unspecified ovary: Secondary | ICD-10-CM

## 2013-01-02 DIAGNOSIS — I82409 Acute embolism and thrombosis of unspecified deep veins of unspecified lower extremity: Secondary | ICD-10-CM

## 2013-01-02 DIAGNOSIS — I82402 Acute embolism and thrombosis of unspecified deep veins of left lower extremity: Secondary | ICD-10-CM

## 2013-01-02 DIAGNOSIS — C9000 Multiple myeloma not having achieved remission: Secondary | ICD-10-CM

## 2013-01-02 DIAGNOSIS — J90 Pleural effusion, not elsewhere classified: Secondary | ICD-10-CM

## 2013-01-02 LAB — CBC WITH DIFFERENTIAL/PLATELET
BASO%: 1.4 % (ref 0.0–2.0)
EOS%: 1.1 % (ref 0.0–7.0)
Eosinophils Absolute: 0.1 10*3/uL (ref 0.0–0.5)
LYMPH%: 26.7 % (ref 14.0–49.7)
MCH: 30.7 pg (ref 25.1–34.0)
MCHC: 32.5 g/dL (ref 31.5–36.0)
MCV: 94.7 fL (ref 79.5–101.0)
MONO%: 15.1 % — ABNORMAL HIGH (ref 0.0–14.0)
Platelets: 207 10*3/uL (ref 145–400)
RBC: 3.19 10*6/uL — ABNORMAL LOW (ref 3.70–5.45)
RDW: 18.7 % — ABNORMAL HIGH (ref 11.2–14.5)
nRBC: 1 % — ABNORMAL HIGH (ref 0–0)

## 2013-01-02 LAB — COMPREHENSIVE METABOLIC PANEL (CC13)
AST: 37 U/L — ABNORMAL HIGH (ref 5–34)
BUN: 18.7 mg/dL (ref 7.0–26.0)
CO2: 23 mEq/L (ref 22–29)
Calcium: 8.7 mg/dL (ref 8.4–10.4)
Chloride: 103 mEq/L (ref 98–109)
Creatinine: 0.7 mg/dL (ref 0.6–1.1)
Total Bilirubin: 0.23 mg/dL (ref 0.20–1.20)

## 2013-01-02 LAB — POCT INR: INR: 2

## 2013-01-04 LAB — IMMUNOFIXATION ELECTROPHORESIS
IgM, Serum: 10 mg/dL — ABNORMAL LOW (ref 52–322)
Total Protein, Serum Electrophoresis: 6.3 g/dL (ref 6.0–8.3)

## 2013-01-04 LAB — KAPPA/LAMBDA LIGHT CHAINS
Kappa free light chain: 5.43 mg/dL — ABNORMAL HIGH (ref 0.33–1.94)
Kappa:Lambda Ratio: 3.62 — ABNORMAL HIGH (ref 0.26–1.65)

## 2013-01-05 ENCOUNTER — Telehealth: Payer: Self-pay | Admitting: *Deleted

## 2013-01-05 NOTE — Telephone Encounter (Signed)
Pt called this am for lab results.  She was informed of Dr Patsy Lager comments & she reports that she is very pleased & states she is doing well.

## 2013-01-05 NOTE — Telephone Encounter (Signed)
Message copied by Sabino Snipes on Thu Jan 05, 2013 10:25 AM ------      Message from: Levert Feinstein      Created: Wed Jan 04, 2013  7:43 PM       Call pt: lab some improvement:        IgA 704 down from 984 in June      Ca-125 45  Was 106 in August       ------

## 2013-01-09 ENCOUNTER — Ambulatory Visit: Payer: Medicare Other | Admitting: Pharmacist

## 2013-01-09 DIAGNOSIS — I82402 Acute embolism and thrombosis of unspecified deep veins of left lower extremity: Secondary | ICD-10-CM

## 2013-01-09 NOTE — Progress Notes (Signed)
INR = 2 drawn 01/02/13 I left pt voicemail w/ result.   Instructed her to continue Coumadin 5 mg daily. Recheck INR 01/16/13 (lab already scheduled). We will call pt when result available. NO CHARGE- phone encounter. Ebony Hail, Pharm.D., CPP 01/09/2013@3 :50 PM

## 2013-01-11 ENCOUNTER — Other Ambulatory Visit: Payer: Self-pay | Admitting: *Deleted

## 2013-01-11 DIAGNOSIS — C9000 Multiple myeloma not having achieved remission: Secondary | ICD-10-CM

## 2013-01-11 MED ORDER — LENALIDOMIDE 10 MG PO CAPS: 10.0000 mg | ORAL_CAPSULE | Freq: Every day | ORAL | Status: DC

## 2013-01-11 NOTE — Addendum Note (Signed)
Addended by: Wandalee Ferdinand on: 01/11/2013 03:33 PM   Modules accepted: Orders

## 2013-01-11 NOTE — Telephone Encounter (Signed)
Revlimid refill request to MD desk for approval. Had to perform survey over phone-computer system did not allow survey-could not locate patient in system. [employer] representative will have IT investigate issue.

## 2013-01-16 ENCOUNTER — Other Ambulatory Visit (HOSPITAL_BASED_OUTPATIENT_CLINIC_OR_DEPARTMENT_OTHER): Payer: Medicare Other

## 2013-01-16 ENCOUNTER — Ambulatory Visit: Payer: Medicare Other | Admitting: Pharmacist

## 2013-01-16 DIAGNOSIS — I82402 Acute embolism and thrombosis of unspecified deep veins of left lower extremity: Secondary | ICD-10-CM

## 2013-01-16 DIAGNOSIS — I82409 Acute embolism and thrombosis of unspecified deep veins of unspecified lower extremity: Secondary | ICD-10-CM

## 2013-01-16 DIAGNOSIS — J90 Pleural effusion, not elsewhere classified: Secondary | ICD-10-CM

## 2013-01-16 DIAGNOSIS — C9000 Multiple myeloma not having achieved remission: Secondary | ICD-10-CM

## 2013-01-16 LAB — CBC WITH DIFFERENTIAL/PLATELET
BASO%: 0.8 % (ref 0.0–2.0)
EOS%: 3.1 % (ref 0.0–7.0)
MCH: 30.6 pg (ref 25.1–34.0)
MCHC: 33 g/dL (ref 31.5–36.0)
RDW: 18.7 % — ABNORMAL HIGH (ref 11.2–14.5)
lymph#: 0.7 10*3/uL — ABNORMAL LOW (ref 0.9–3.3)
nRBC: 0 % (ref 0–0)

## 2013-01-16 LAB — POCT INR: INR: 2.3

## 2013-01-16 LAB — PROTIME-INR: INR: 2.3 (ref 2.00–3.50)

## 2013-01-16 NOTE — Patient Instructions (Signed)
INR = 2 drawn 01/16/13  I left pt voicemail w/ result and to call if she has any issues/bruising or bleeding.  Instructed her to continue Coumadin 5 mg daily.  Recheck INR 01/30/13 (lab already scheduled).   NO CHARGE- phone encounter. 

## 2013-01-16 NOTE — Progress Notes (Signed)
INR = 2 drawn 01/16/13  I left pt voicemail w/ result and to call if she has any issues/bruising or bleeding.  Instructed her to continue Coumadin 5 mg daily.  Recheck INR 01/30/13 (lab already scheduled).   NO CHARGE- phone encounter.

## 2013-01-30 ENCOUNTER — Other Ambulatory Visit (HOSPITAL_BASED_OUTPATIENT_CLINIC_OR_DEPARTMENT_OTHER): Payer: Medicare Other

## 2013-01-30 ENCOUNTER — Ambulatory Visit (INDEPENDENT_AMBULATORY_CARE_PROVIDER_SITE_OTHER): Payer: Medicare Other | Admitting: Pharmacist

## 2013-01-30 ENCOUNTER — Encounter (INDEPENDENT_AMBULATORY_CARE_PROVIDER_SITE_OTHER): Payer: Self-pay

## 2013-01-30 DIAGNOSIS — C9000 Multiple myeloma not having achieved remission: Secondary | ICD-10-CM

## 2013-01-30 DIAGNOSIS — I82409 Acute embolism and thrombosis of unspecified deep veins of unspecified lower extremity: Secondary | ICD-10-CM

## 2013-01-30 DIAGNOSIS — C569 Malignant neoplasm of unspecified ovary: Secondary | ICD-10-CM

## 2013-01-30 DIAGNOSIS — I82402 Acute embolism and thrombosis of unspecified deep veins of left lower extremity: Secondary | ICD-10-CM

## 2013-01-30 DIAGNOSIS — J90 Pleural effusion, not elsewhere classified: Secondary | ICD-10-CM

## 2013-01-30 LAB — CBC WITH DIFFERENTIAL/PLATELET
BASO%: 1.7 % (ref 0.0–2.0)
Eosinophils Absolute: 0.1 10*3/uL (ref 0.0–0.5)
LYMPH%: 25.4 % (ref 14.0–49.7)
MCH: 30.5 pg (ref 25.1–34.0)
MCHC: 32.6 g/dL (ref 31.5–36.0)
MCV: 93.5 fL (ref 79.5–101.0)
MONO%: 15.8 % — ABNORMAL HIGH (ref 0.0–14.0)
Platelets: 195 10*3/uL (ref 145–400)
RBC: 3.42 10*6/uL — ABNORMAL LOW (ref 3.70–5.45)

## 2013-01-30 LAB — POCT INR: INR: 2

## 2013-01-30 LAB — PROTIME-INR: Protime: 24 Seconds — ABNORMAL HIGH (ref 10.6–13.4)

## 2013-01-30 LAB — COMPREHENSIVE METABOLIC PANEL (CC13)
AST: 27 U/L (ref 5–34)
Albumin: 3.3 g/dL — ABNORMAL LOW (ref 3.5–5.0)
Alkaline Phosphatase: 85 U/L (ref 40–150)
BUN: 15.7 mg/dL (ref 7.0–26.0)
Creatinine: 0.7 mg/dL (ref 0.6–1.1)
Potassium: 4.2 mEq/L (ref 3.5–5.1)

## 2013-01-30 NOTE — Progress Notes (Signed)
Spoke to pt on phone-no charge encounter.  INR remains at goal on coumadin 5mg  daily.  CBC stable WBC=2.9 H/H=10.4/32 Plt=195.  No changes in medications.  No unusual bleeding or bruising.  Heidi Pratt states she is doing well.  She has an appt with Dr. Cyndie Chime on 02/03/13.  Will recheck PT/INR in 2 weeks with scheduled lab on 02/13/13.

## 2013-02-01 LAB — IMMUNOFIXATION ELECTROPHORESIS: IgM, Serum: 14 mg/dL — ABNORMAL LOW (ref 52–322)

## 2013-02-01 LAB — KAPPA/LAMBDA LIGHT CHAINS
Kappa:Lambda Ratio: 3.46 — ABNORMAL HIGH (ref 0.26–1.65)
Lambda Free Lght Chn: 1.26 mg/dL (ref 0.57–2.63)

## 2013-02-03 ENCOUNTER — Ambulatory Visit (HOSPITAL_BASED_OUTPATIENT_CLINIC_OR_DEPARTMENT_OTHER): Payer: Medicare Other | Admitting: Oncology

## 2013-02-03 ENCOUNTER — Telehealth: Payer: Self-pay | Admitting: Oncology

## 2013-02-03 VITALS — BP 168/85 | HR 98 | Temp 96.8°F | Resp 17 | Ht 63.0 in | Wt 127.6 lb

## 2013-02-03 DIAGNOSIS — C9000 Multiple myeloma not having achieved remission: Secondary | ICD-10-CM

## 2013-02-03 DIAGNOSIS — I82409 Acute embolism and thrombosis of unspecified deep veins of unspecified lower extremity: Secondary | ICD-10-CM

## 2013-02-03 DIAGNOSIS — Z8543 Personal history of malignant neoplasm of ovary: Secondary | ICD-10-CM

## 2013-02-03 DIAGNOSIS — C562 Malignant neoplasm of left ovary: Secondary | ICD-10-CM

## 2013-02-03 DIAGNOSIS — J90 Pleural effusion, not elsewhere classified: Secondary | ICD-10-CM

## 2013-02-03 DIAGNOSIS — I82402 Acute embolism and thrombosis of unspecified deep veins of left lower extremity: Secondary | ICD-10-CM

## 2013-02-03 NOTE — Telephone Encounter (Signed)
gv and printed appt sched and avs for pt for NOV thru Jan 2015 °

## 2013-02-04 NOTE — Progress Notes (Signed)
Hematology and Oncology Follow Up Visit  Heidi Pratt [id number] [birth year] 79 y.o. 02/04/2013 5:59 PM   Principle Diagnosis: Encounter Diagnoses  Name Primary?  . Multiple myeloma without mention of remission Yes  . Pleural effusion, right   . Ovarian epithelial cancer, left   . DVT (deep venous thrombosis), left      Interim History:   Followup visit for this 79 year old woman diagnosed with IgA kappa multiple myeloma in November 2012 when she presented with a mild normochromic anemia. She was initially treated with a combination of oral Alkeran and prednisone through April of 2013. She had poor tolerance to the steroids. She did not respond and to this combination. Hematologic profile remained stable. She was given a treatment holiday. Total serum IgA and serum free light chains began to rise concomitant with the development of a symptomatic right pleural effusion. A thoracentesis was done on 09/05/2012 and unexpectedly revealed a plasma cell infiltrate. She was started back on treatment with single agent Revlimid 10 mg by mouth 21 days on 7 day rest without any steroids beginning in mid July. She experienced initial significant fatigue from the drug but no other toxicities and I encouraged her to stay on it. She developed a acute bronchitis requiring oral antibiotics in August and stopped her Revlimid that month. She subsequently resumed the drug and has now had a total of 3 cycles. She reports no new side effects. She is getting some intermittent mild paresthesias primarily of her right hand. No rash. No constipation. She is back to her normal activity level. She reports no bone pain.  We are seeing some signs of response. Peak IgA level was 984 mg percent on June 12 with kappa free light chains at 10.6 mg percent. Current value on October 27 with total IgA 639 mg percent and kappa free light chains 4.4 mg percent.    Medications: reviewed  Allergies:  Allergies  Allergen  Reactions  . Amoxicillin Diarrhea  . Crab [Shellfish Allergy] Nausea And Vomiting    Review of Systems: Hematology: negative for swollen glands, easy bruising, ENT ROS: negative for - oral lesions or sore throat Breast ROS:  Respiratory ROS: negative for - cough, pleuritic pain, shortness of breath or wheezing Cardiovascular ROS: negative for - chest pain, dyspnea on exertion, edema, irregular heartbeat, murmur, orthopnea, palpitations, paroxysmal nocturnal dyspnea or rapid heart rate Gastrointestinal ROS: negative for - abdominal pain, distention, appetite loss, blood in stools, change in bowel habits, constipation, diarrhea, heartburn, hematemesis, melena, nausea/vomiting or swallowing difficulty/pain Genito-Urinary ROS: negative for - , dysuria, hematuria, incontinence,r urinary frequency/urgency Musculoskeletal ROS: Positive for chronic joint pain, joint stiffness, secondary to arthritis  Neurological ROS: negative for - behavioral changes, confusion, dizziness, gait disturbance, headaches, impaired coordination/balance, memory loss, positive mild numbness/tingling, of her fingers intermittent right hand greater than left Dermatological ROS: negative for rash, ecchymosis Remaining ROS negative.  Physical Exam: Blood pressure 168/85, pulse 98, temperature 96.8 F (36 C), temperature source Oral, resp. rate 17, height 5\' 3"  (1.6 m), weight 127 lb 9.6 oz (57.879 kg), SpO2 97.00%. Wt Readings from Last 3 Encounters:  02/03/13 127 lb 9.6 oz (57.879 kg)  12/02/12 121 lb 3.2 oz (54.976 kg)  10/24/12 127 lb 9.6 oz (57.879 kg)     General appearance: Pleasant well-nourished Caucasian woman HENNT: Pharynx no erythema, exudate, mass, or ulcer. No thyromegaly or thyroid nodules Lymph nodes: No cervical, supraclavicular, or axillary lymphadenopathy Breasts:  Lungs: Clear to auscultation, resonant to percussion throughout Heart: Regular  rhythm, no murmur, no gallop, no rub, no click, no  edema Abdomen: Soft, nontender, normal bowel sounds, no mass, no organomegaly, no fluid wave Extremities: No edema, no calf tenderness Musculoskeletal:  joint deformities of her fingers from osteoarthritis GU: Vascular: Carotid pulses 2+, no bruits,  Neurologic: Alert, oriented, PERRLA, optic discs sharp and vessels normal, no hemorrhage or exudate, cranial nerves grossly normal, motor strength 5 over 5, reflexes 1+ symmetric, upper body coordination normal, gait normal, sensation intact to vibration over the fingertips by tuning fork exam Skin: No rash or ecchymosis  Lab Results: CBC W/Diff    Component Value Date/Time   WBC 2.9* 01/30/2013 1113   WBC 5.2 01/29/2011 0812   RBC 3.42* 01/30/2013 1113   RBC 3.44* 01/29/2011 0812   HGB 10.4* 01/30/2013 1113   HGB 11.1* 01/29/2011 0812   HCT 32.0* 01/30/2013 1113   HCT 31.9* 01/29/2011 0812   PLT 195 01/30/2013 1113   PLT 235 01/29/2011 0812   MCV 93.5 01/30/2013 1113   MCV 92.7 01/29/2011 0812   MCH 30.5 01/30/2013 1113   MCH 32.3 01/29/2011 0812   MCHC 32.6 01/30/2013 1113   MCHC 34.8 01/29/2011 0812   RDW 19.2* 01/30/2013 1113   RDW 16.3* 01/29/2011 0812   LYMPHSABS 0.7* 01/30/2013 1113   LYMPHSABS 1.5 01/29/2011 0812   MONOABS 0.5 01/30/2013 1113   MONOABS 0.6 01/29/2011 0812   EOSABS 0.1 01/30/2013 1113   EOSABS 0.1 01/29/2011 0812   BASOSABS 0.0 01/30/2013 1113   BASOSABS 0.0 01/29/2011 0812     Chemistry      Component Value Date/Time   NA 135* 01/30/2013 1113   NA 137 10/05/2011 1122   K 4.2 01/30/2013 1113   K 4.5 10/05/2011 1122   CL 98 09/15/2012 1143   CL 101 10/05/2011 1122   CO2 23 01/30/2013 1113   CO2 24 10/05/2011 1122   BUN 15.7 01/30/2013 1113   BUN 21 10/05/2011 1122   CREATININE 0.7 01/30/2013 1113   CREATININE 0.83 10/05/2011 1122   CREATININE 0.72 12/26/2010 1039      Component Value Date/Time   CALCIUM 9.1 01/30/2013 1113   CALCIUM 9.1 10/05/2011 1122   ALKPHOS 85 01/30/2013 1113   ALKPHOS 68 10/05/2011  1122   AST 27 01/30/2013 1113   AST 21 10/05/2011 1122   ALT 27 01/30/2013 1113   ALT 16 10/05/2011 1122   BILITOT 0.26 01/30/2013 1113   BILITOT 0.3 10/05/2011 1122        Impression:  #1. IgA kappa multiple myeloma She appears to be responding to single agent Revlimid. Hematologic profile is stable and serum total IgA and free light chains are slowly going down. I will continue current dose and schedule 10 mg by mouth 3 weeks on one week rest period She is due for a routine followup bone survey. I will monitor her paraprotein levels every 2 months. Routine labs every month.  #2. Plasmacytic right pleural effusion No clinical recurrence on Revlimid.  #3. Stage I ovarian cancer treated by unilateral oophorectomy May 2009 Her CA 125 tumor marker fluctuates widely and seems to go up whenever she gets an infection and is not correlating very well with any evidence for recurrent ovarian cancer.  #4. History of severe C. difficile colitis in the past-resolved.  #5. Degenerative arthritis.  #6. Hypothyroid on replacement.  #7. Essential hypertension.  #8. History of pneumonia October 2010 colitis during that admission.  #9. History of a positive PPD. Granulomas on CT  scan of the chest. Stable. #10. Left lower extremity DVT secondary to Revlimid now on full dose Coumadin anticoagulation.    CC: Patient Care Team: Lillia Mountain, MD as PCP - General (Internal Medicine)   Levert Feinstein, MD 11/1/20145:59 PM

## 2013-02-09 ENCOUNTER — Other Ambulatory Visit: Payer: Self-pay | Admitting: Oncology

## 2013-02-09 ENCOUNTER — Other Ambulatory Visit: Payer: Self-pay | Admitting: *Deleted

## 2013-02-09 DIAGNOSIS — C9000 Multiple myeloma not having achieved remission: Secondary | ICD-10-CM

## 2013-02-09 DIAGNOSIS — J90 Pleural effusion, not elsewhere classified: Secondary | ICD-10-CM

## 2013-02-09 MED ORDER — LENALIDOMIDE 10 MG PO CAPS: 10.0000 mg | ORAL_CAPSULE | Freq: Every day | ORAL | Status: DC

## 2013-02-09 NOTE — Addendum Note (Signed)
Addended by: Arvilla Meres on: 02/09/2013 03:03 PM   Modules accepted: Orders

## 2013-02-09 NOTE — Telephone Encounter (Signed)
THIS REFILL REQUEST FOR REVLIMID WAS PLACED ON DR.GRANFORTUNA'S DESK. 

## 2013-02-13 ENCOUNTER — Telehealth: Payer: Self-pay | Admitting: Pharmacist

## 2013-02-13 ENCOUNTER — Other Ambulatory Visit: Payer: Medicare Other

## 2013-02-13 ENCOUNTER — Ambulatory Visit
Admission: RE | Admit: 2013-02-13 | Discharge: 2013-02-13 | Disposition: A | Discharge status: Home / Self Care | Payer: Medicare Other | Source: Ambulatory Visit | Attending: Neurological Surgery | Admitting: Neurological Surgery

## 2013-02-13 DIAGNOSIS — S129XXA Fracture of neck, unspecified, initial encounter: Secondary | ICD-10-CM

## 2013-02-20 ENCOUNTER — Other Ambulatory Visit: Payer: Medicare Other | Admitting: Lab

## 2013-02-20 ENCOUNTER — Other Ambulatory Visit (HOSPITAL_BASED_OUTPATIENT_CLINIC_OR_DEPARTMENT_OTHER): Payer: Medicare Other

## 2013-02-20 ENCOUNTER — Ambulatory Visit (INDEPENDENT_AMBULATORY_CARE_PROVIDER_SITE_OTHER): Payer: Medicare Other | Admitting: Pharmacist

## 2013-02-20 ENCOUNTER — Other Ambulatory Visit: Payer: Medicare Other

## 2013-02-20 DIAGNOSIS — C9000 Multiple myeloma not having achieved remission: Secondary | ICD-10-CM

## 2013-02-20 DIAGNOSIS — I82402 Acute embolism and thrombosis of unspecified deep veins of left lower extremity: Secondary | ICD-10-CM

## 2013-02-20 DIAGNOSIS — J90 Pleural effusion, not elsewhere classified: Secondary | ICD-10-CM

## 2013-02-20 DIAGNOSIS — I82409 Acute embolism and thrombosis of unspecified deep veins of unspecified lower extremity: Secondary | ICD-10-CM

## 2013-02-20 LAB — CBC WITH DIFFERENTIAL/PLATELET
Basophils Absolute: 0 10*3/uL (ref 0.0–0.1)
Eosinophils Absolute: 0.1 10*3/uL (ref 0.0–0.5)
HCT: 31.6 % — ABNORMAL LOW (ref 34.8–46.6)
HGB: 10.4 g/dL — ABNORMAL LOW (ref 11.6–15.9)
LYMPH%: 26.9 % (ref 14.0–49.7)
MCV: 91.3 fL (ref 79.5–101.0)
MONO%: 17.8 % — ABNORMAL HIGH (ref 0.0–14.0)
NEUT#: 1.4 10*3/uL — ABNORMAL LOW (ref 1.5–6.5)
NEUT%: 52.4 % (ref 38.4–76.8)
Platelets: 167 10*3/uL (ref 145–400)

## 2013-02-20 LAB — POCT INR: INR: 1.9

## 2013-02-20 NOTE — Patient Instructions (Signed)
INR right at goal No changes Continue coumadin 5mg  daily.  Recheck INR on 03/06/13; lab at 11:15am. The pharmacist will call you to discuss your results.

## 2013-02-20 NOTE — Progress Notes (Signed)
INR right below goal at 1.9 today Spoke to pt on phone-no charge encounter.  INR remains at goal on coumadin 5mg  daily. CBC stable WBC=2.8 H/H=10.4/31.6 Plt=167.  No changes in medications.  No unusual bleeding or bruising.  Heidi Pratt states she is doing well.  Will recheck PT/INR in 2 weeks with scheduled lab on 03/06/13.

## 2013-02-21 ENCOUNTER — Other Ambulatory Visit: Payer: Self-pay | Admitting: *Deleted

## 2013-02-21 DIAGNOSIS — I82402 Acute embolism and thrombosis of unspecified deep veins of left lower extremity: Secondary | ICD-10-CM

## 2013-02-21 MED ORDER — WARFARIN SODIUM 5 MG PO TABS: 5.0000 mg | ORAL_TABLET | Freq: Every day | ORAL | Status: DC

## 2013-02-23 ENCOUNTER — Ambulatory Visit: Payer: Medicare Other | Admitting: Oncology

## 2013-03-06 ENCOUNTER — Ambulatory Visit: Payer: Medicare Other | Admitting: Pharmacist

## 2013-03-06 ENCOUNTER — Ambulatory Visit (HOSPITAL_COMMUNITY)
Admission: RE | Admit: 2013-03-06 | Discharge: 2013-03-06 | Disposition: A | Discharge status: Home / Self Care | Payer: Medicare Other | Source: Ambulatory Visit | Attending: Oncology | Admitting: Oncology

## 2013-03-06 ENCOUNTER — Other Ambulatory Visit (HOSPITAL_BASED_OUTPATIENT_CLINIC_OR_DEPARTMENT_OTHER): Payer: Medicare Other

## 2013-03-06 DIAGNOSIS — J90 Pleural effusion, not elsewhere classified: Secondary | ICD-10-CM

## 2013-03-06 DIAGNOSIS — I82409 Acute embolism and thrombosis of unspecified deep veins of unspecified lower extremity: Secondary | ICD-10-CM

## 2013-03-06 DIAGNOSIS — C9000 Multiple myeloma not having achieved remission: Secondary | ICD-10-CM

## 2013-03-06 DIAGNOSIS — M51379 Other intervertebral disc degeneration, lumbosacral region without mention of lumbar back pain or lower extremity pain: Secondary | ICD-10-CM | POA: Insufficient documentation

## 2013-03-06 DIAGNOSIS — M5137 Other intervertebral disc degeneration, lumbosacral region: Secondary | ICD-10-CM | POA: Insufficient documentation

## 2013-03-06 DIAGNOSIS — I82402 Acute embolism and thrombosis of unspecified deep veins of left lower extremity: Secondary | ICD-10-CM

## 2013-03-06 DIAGNOSIS — M4310 Spondylolisthesis, site unspecified: Secondary | ICD-10-CM | POA: Insufficient documentation

## 2013-03-06 DIAGNOSIS — I6529 Occlusion and stenosis of unspecified carotid artery: Secondary | ICD-10-CM | POA: Insufficient documentation

## 2013-03-06 DIAGNOSIS — M5030 Other cervical disc degeneration, unspecified cervical region: Secondary | ICD-10-CM | POA: Insufficient documentation

## 2013-03-06 LAB — COMPREHENSIVE METABOLIC PANEL (CC13)
ALT: 24 U/L (ref 0–55)
AST: 23 U/L (ref 5–34)
Albumin: 3.4 g/dL — ABNORMAL LOW (ref 3.5–5.0)
Anion Gap: 9 mEq/L (ref 3–11)
CO2: 22 mEq/L (ref 22–29)
Calcium: 8.7 mg/dL (ref 8.4–10.4)
Chloride: 100 mEq/L (ref 98–109)
Creatinine: 0.8 mg/dL (ref 0.6–1.1)
Potassium: 4 mEq/L (ref 3.5–5.1)

## 2013-03-06 LAB — CBC WITH DIFFERENTIAL/PLATELET
BASO%: 1.5 % (ref 0.0–2.0)
Basophils Absolute: 0 10*3/uL (ref 0.0–0.1)
HCT: 32.6 % — ABNORMAL LOW (ref 34.8–46.6)
HGB: 10.6 g/dL — ABNORMAL LOW (ref 11.6–15.9)
MONO#: 0.2 10*3/uL (ref 0.1–0.9)
NEUT%: 70 % (ref 38.4–76.8)
RDW: 20 % — ABNORMAL HIGH (ref 11.2–14.5)
WBC: 3.1 10*3/uL — ABNORMAL LOW (ref 3.9–10.3)
lymph#: 0.6 10*3/uL — ABNORMAL LOW (ref 0.9–3.3)

## 2013-03-06 LAB — PROTIME-INR: INR: 2 (ref 2.00–3.50)

## 2013-03-06 NOTE — Progress Notes (Signed)
INR = 2    Goal 2-3 Called patient at home with results. INR within goal range and stable. No complications of anticoag

## 2013-03-08 ENCOUNTER — Telehealth: Payer: Self-pay | Admitting: *Deleted

## 2013-03-08 LAB — IMMUNOFIXATION ELECTROPHORESIS
IgA: 546 mg/dL — ABNORMAL HIGH (ref 69–380)
IgG (Immunoglobin G), Serum: 533 mg/dL — ABNORMAL LOW (ref 690–1700)
IgM, Serum: 12 mg/dL — ABNORMAL LOW (ref 52–322)
Total Protein, Serum Electrophoresis: 6.1 g/dL (ref 6.0–8.3)

## 2013-03-08 LAB — KAPPA/LAMBDA LIGHT CHAINS: Kappa:Lambda Ratio: 4.45 — ABNORMAL HIGH (ref 0.26–1.65)

## 2013-03-08 NOTE — Telephone Encounter (Signed)
Per Dr. Cyndie Chime; notified pt no myeloma changes on bone X-rays; on CXR--no re-accumulation of fluid.  Pt verbalized "Oh happy day" voiced understanding of information and confirmed appt for 04/07/13 with MD.

## 2013-03-08 NOTE — Telephone Encounter (Signed)
Message copied by Gala Romney on Wed Mar 08, 2013 10:03 AM ------      Message from: Levert Feinstein      Created: Mon Mar 06, 2013  6:46 PM       Call pt: no myeloma changes on bone X-rays.  No re-accumulation of fluid on CXR ------

## 2013-03-09 ENCOUNTER — Other Ambulatory Visit: Payer: Self-pay | Admitting: *Deleted

## 2013-03-09 NOTE — Telephone Encounter (Signed)
THIS REFILL REQUEST FOR REVLIMID WAS PLACED ON DR.GRANFORTUNA'S DESK. 

## 2013-03-15 ENCOUNTER — Other Ambulatory Visit: Payer: Self-pay | Admitting: *Deleted

## 2013-03-15 DIAGNOSIS — C9000 Multiple myeloma not having achieved remission: Secondary | ICD-10-CM

## 2013-03-15 MED ORDER — LENALIDOMIDE 10 MG PO CAPS: 10.0000 mg | ORAL_CAPSULE | Freq: Every day | ORAL | Status: DC

## 2013-03-20 ENCOUNTER — Other Ambulatory Visit (HOSPITAL_BASED_OUTPATIENT_CLINIC_OR_DEPARTMENT_OTHER): Payer: Medicare Other

## 2013-03-20 ENCOUNTER — Encounter (INDEPENDENT_AMBULATORY_CARE_PROVIDER_SITE_OTHER): Payer: Self-pay

## 2013-03-20 ENCOUNTER — Ambulatory Visit: Payer: Medicare Other | Admitting: Pharmacist

## 2013-03-20 DIAGNOSIS — C9000 Multiple myeloma not having achieved remission: Secondary | ICD-10-CM

## 2013-03-20 DIAGNOSIS — I82409 Acute embolism and thrombosis of unspecified deep veins of unspecified lower extremity: Secondary | ICD-10-CM

## 2013-03-20 DIAGNOSIS — I82402 Acute embolism and thrombosis of unspecified deep veins of left lower extremity: Secondary | ICD-10-CM

## 2013-03-20 DIAGNOSIS — J90 Pleural effusion, not elsewhere classified: Secondary | ICD-10-CM

## 2013-03-20 LAB — CBC WITH DIFFERENTIAL/PLATELET
Eosinophils Absolute: 0.1 10*3/uL (ref 0.0–0.5)
HCT: 31.8 % — ABNORMAL LOW (ref 34.8–46.6)
LYMPH%: 21.2 % (ref 14.0–49.7)
MONO#: 0.4 10*3/uL (ref 0.1–0.9)
NEUT#: 1.6 10*3/uL (ref 1.5–6.5)
NEUT%: 57.6 % (ref 38.4–76.8)
Platelets: 154 10*3/uL (ref 145–400)
RBC: 3.47 10*6/uL — ABNORMAL LOW (ref 3.70–5.45)
RDW: 19 % — ABNORMAL HIGH (ref 11.2–14.5)
WBC: 2.8 10*3/uL — ABNORMAL LOW (ref 3.9–10.3)
nRBC: 1 % — ABNORMAL HIGH (ref 0–0)

## 2013-03-20 LAB — PROTIME-INR: INR: 2.5 (ref 2.00–3.50)

## 2013-03-20 LAB — POCT INR: INR: 2.5

## 2013-03-20 NOTE — Progress Notes (Signed)
Telephone encounter - No Charge** INR at goal No changes Pt doing well No issues to report No unusual bleeding or bruising No medication or diet changes Pt has been stable on current dose Plan:  Continue coumadin 5mg  daily.  Recheck INR on 04/03/13; lab at 11:15am. The pharmacist will call you to discuss your results.

## 2013-03-20 NOTE — Patient Instructions (Signed)
INR at goal No changes Continue coumadin 5mg  daily.  Recheck INR on 04/03/13; lab at 11:15am. The pharmacist will call you to discuss your results.

## 2013-04-03 ENCOUNTER — Other Ambulatory Visit (HOSPITAL_BASED_OUTPATIENT_CLINIC_OR_DEPARTMENT_OTHER): Payer: Medicare Other

## 2013-04-03 ENCOUNTER — Ambulatory Visit: Payer: Medicare Other | Admitting: Pharmacist

## 2013-04-03 ENCOUNTER — Encounter (INDEPENDENT_AMBULATORY_CARE_PROVIDER_SITE_OTHER): Payer: Self-pay

## 2013-04-03 DIAGNOSIS — I82402 Acute embolism and thrombosis of unspecified deep veins of left lower extremity: Secondary | ICD-10-CM

## 2013-04-03 DIAGNOSIS — J90 Pleural effusion, not elsewhere classified: Secondary | ICD-10-CM

## 2013-04-03 DIAGNOSIS — C9000 Multiple myeloma not having achieved remission: Secondary | ICD-10-CM

## 2013-04-03 DIAGNOSIS — I82409 Acute embolism and thrombosis of unspecified deep veins of unspecified lower extremity: Secondary | ICD-10-CM

## 2013-04-03 LAB — CBC WITH DIFFERENTIAL/PLATELET
BASO%: 0.6 % (ref 0.0–2.0)
Basophils Absolute: 0 10*3/uL (ref 0.0–0.1)
EOS%: 1.7 % (ref 0.0–7.0)
HGB: 10.7 g/dL — ABNORMAL LOW (ref 11.6–15.9)
MCH: 30.4 pg (ref 25.1–34.0)
MCHC: 33.5 g/dL (ref 31.5–36.0)
MONO#: 0.2 10*3/uL (ref 0.1–0.9)
NEUT#: 2.7 10*3/uL (ref 1.5–6.5)
RBC: 3.52 10*6/uL — ABNORMAL LOW (ref 3.70–5.45)
RDW: 18.9 % — ABNORMAL HIGH (ref 11.2–14.5)
lymph#: 0.6 10*3/uL — ABNORMAL LOW (ref 0.9–3.3)
nRBC: 0 % (ref 0–0)

## 2013-04-03 LAB — POCT INR: INR: 1.5

## 2013-04-03 LAB — PROTIME-INR
INR: 1.5 — ABNORMAL LOW (ref 2.00–3.50)
Protime: 18 Seconds — ABNORMAL HIGH (ref 10.6–13.4)

## 2013-04-03 NOTE — Patient Instructions (Signed)
INR below goal due to missed dose Take 7.5 mg tonight (1 1/2 tab) then Continue coumadin 5mg  daily.  Recheck INR on 04/24/13; lab at 11:00am. The pharmacist will call you to discuss your results.

## 2013-04-03 NOTE — Progress Notes (Signed)
*  Telephone Encounter - No Charge* INR below goal Pt missed 1 dose over the weekend Pt is doing well with no complaints No unusual bleeding or bruising No diet or medication changes Pt has been very stable on 5 mg daily Will have pt: Take 7.5 mg tonight (1 1/2 tab) then Continue coumadin 5mg  daily.  Recheck INR on 04/24/13; lab at 11:00am. The pharmacist will call to discuss results.

## 2013-04-05 ENCOUNTER — Ambulatory Visit (HOSPITAL_BASED_OUTPATIENT_CLINIC_OR_DEPARTMENT_OTHER): Payer: Medicare Other

## 2013-04-05 ENCOUNTER — Other Ambulatory Visit: Payer: Self-pay | Admitting: Oncology

## 2013-04-05 DIAGNOSIS — C569 Malignant neoplasm of unspecified ovary: Secondary | ICD-10-CM

## 2013-04-05 DIAGNOSIS — C562 Malignant neoplasm of left ovary: Secondary | ICD-10-CM

## 2013-04-05 DIAGNOSIS — C9000 Multiple myeloma not having achieved remission: Secondary | ICD-10-CM

## 2013-04-05 LAB — COMPREHENSIVE METABOLIC PANEL (CC13)
Albumin: 3.5 g/dL (ref 3.5–5.0)
Alkaline Phosphatase: 72 U/L (ref 40–150)
Anion Gap: 10 mEq/L (ref 3–11)
BUN: 27.7 mg/dL — ABNORMAL HIGH (ref 7.0–26.0)
Chloride: 99 mEq/L (ref 98–109)
Creatinine: 0.8 mg/dL (ref 0.6–1.1)
Glucose: 94 mg/dl (ref 70–140)
Potassium: 4.4 mEq/L (ref 3.5–5.1)
Total Bilirubin: 0.21 mg/dL (ref 0.20–1.20)

## 2013-04-07 ENCOUNTER — Other Ambulatory Visit: Payer: Medicare Other | Admitting: Lab

## 2013-04-07 ENCOUNTER — Ambulatory Visit (HOSPITAL_BASED_OUTPATIENT_CLINIC_OR_DEPARTMENT_OTHER): Payer: Medicare Other | Admitting: Oncology

## 2013-04-07 ENCOUNTER — Telehealth: Payer: Self-pay | Admitting: Oncology

## 2013-04-07 VITALS — BP 172/91 | HR 70 | Temp 98.0°F | Resp 18 | Ht 63.0 in | Wt 128.5 lb

## 2013-04-07 DIAGNOSIS — I82409 Acute embolism and thrombosis of unspecified deep veins of unspecified lower extremity: Secondary | ICD-10-CM

## 2013-04-07 DIAGNOSIS — C9000 Multiple myeloma not having achieved remission: Secondary | ICD-10-CM

## 2013-04-07 DIAGNOSIS — I10 Essential (primary) hypertension: Secondary | ICD-10-CM

## 2013-04-07 DIAGNOSIS — C569 Malignant neoplasm of unspecified ovary: Secondary | ICD-10-CM

## 2013-04-07 DIAGNOSIS — J90 Pleural effusion, not elsewhere classified: Secondary | ICD-10-CM

## 2013-04-07 NOTE — Telephone Encounter (Signed)
appts made per 1/2 POF AVS and CAL given shh

## 2013-04-07 NOTE — Progress Notes (Signed)
Hematology and Oncology Follow Up Visit  Heidi Pratt [id number] 04-02-1919 80 y.o. 04/07/2013 6:18 PM   Principle Diagnosis: Encounter Diagnosis  Name Primary?  . Multiple myeloma without mention of remission Yes     Interim History:  Followup visit for this 80 year old woman diagnosed with IgA kappa multiple myeloma in November 2012 when she presented with a mild normochromic anemia. She was initially treated with a combination of oral Alkeran and prednisone through April of 2013. She had poor tolerance to the steroids. She did not respond  to this combination although  hematologic profile remained stable. She was given a treatment holiday. Total serum IgA and serum free light chains began to rise concomitant with the development of a symptomatic right pleural effusion. A thoracentesis was done on 09/05/2012 and unexpectedly revealed a plasma cell infiltrate. She was started back on treatment with single agent Revlimid 10 mg by mouth 21 days on 7 day rest without any steroids beginning in mid July. She experienced initial significant fatigue from the drug but no other toxicities and I encouraged her to stay on it. She held  Revlimid in August due to an  intercurrent bronchitis but has been back  on the drug reliably since September. We are now seeing a slow, but steady, improvement in her IgA level and her kappa serum free light chains with decrease in the total IgA from 984 mg percent in June to most recent value of 546 mg percent on 03/06/2013. Fall in the kappa free light chains from 10.6 mg percent to 5.6 as of 04/05/2013. CBC remains stable with hemoglobin 10.7, white count 3600, platelets 212,000. Followup bone survey done 03/06/2013 shows changes from previous left hip replacement. No lytic or sclerotic bone lesions.  She has had no interim medical problems. She is  pleased that she  does not have to wear a cervical neck collar anymore. She is getting some intermittent paresthesias of her  right hand which I believe are likely arthritic in nature. She's had no interim fever or infection. She remains active and was able to enjoy her family over the holiday season. Her physician son accompanies her today.   Medications: reviewed  Allergies:  Allergies  Allergen Reactions  . Amoxicillin Diarrhea  . Crab [Shellfish Allergy] Nausea And Vomiting    Review of Systems: Hematology:  No bleeding or bruising ENT ROS: No sore throat Breast ROS:  Respiratory ROS: No recurrent dyspnea or orthopnea Cardiovascular ROS:   No chest pain or palpitations Gastrointestinal ROS:   No abdominal pain or change in bowel habit Genito-Urinary ROS: No urinary tract symptoms Musculoskeletal ROS: Chronic arthritis pain Neurological ROS: No headache or change in vision. She is getting some intermittent paresthesias on the radial side of her right hand Dermatological ROS: No rash Remaining ROS negative.  Physical Exam: Blood pressure 172/91, pulse 70, temperature 98 F (36.7 C), temperature source Oral, resp. rate 18, height _0  (1.6 m), weight 128 lb 8 oz (58.287 kg), SpO2 97.00%. Wt Readings from Last 3 Encounters:  04/07/13 128 lb 8 oz (58.287 kg)  02/03/13 127 lb 9.6 oz (57.879 kg)  12/02/12 121 lb 3.2 oz (54.976 kg)     General appearance: A sprightly Caucasian woman HENNT: Pharynx no erythema, exudate, mass, or ulcer. No thyromegaly or thyroid nodules Lymph nodes: No cervical, supraclavicular, or axillary lymphadenopathy Breasts: Lungs: Clear to auscultation, resonant to percussion throughout Heart: Regular rhythm, no murmur, no gallop, no rub, no click, no edema Abdomen: Soft, nontender,  normal bowel sounds, no mass, no organomegaly Extremities: No edema, no calf tenderness Musculoskeletal: Advanced osteoarthritic joint deformities of the fingers GU: Vascular: Carotid pulses 2+,  Neurologic: Alert, oriented, PERRLA,, cranial nerves grossly normal, motor strength 5 over 5,  reflexes 1+ symmetric, upper body coordination normal, gait normal, sensation intact to vibration over the fingertips Skin: No rash or ecchymosis  Lab Results: CBC W/Diff    Component Value Date/Time   WBC 3.6* 04/03/2013 1508   WBC 5.2 01/29/2011 0812   RBC 3.52* 04/03/2013 1508   RBC 3.44* 01/29/2011 0812   HGB 10.7* 04/03/2013 1508   HGB 11.1* 01/29/2011 0812   HCT 31.9* 04/03/2013 1508   HCT 31.9* 01/29/2011 0812   PLT 212 04/03/2013 1508   PLT 235 01/29/2011 0812   MCV 90.6 04/03/2013 1508   MCV 92.7 01/29/2011 0812   MCH 30.4 04/03/2013 1508   MCH 32.3 01/29/2011 0812   MCHC 33.5 04/03/2013 1508   MCHC 34.8 01/29/2011 0812   RDW 18.9* 04/03/2013 1508   RDW 16.3* 01/29/2011 0812   LYMPHSABS 0.6* 04/03/2013 1508   LYMPHSABS 1.5 01/29/2011 0812   MONOABS 0.2 04/03/2013 1508   MONOABS 0.6 01/29/2011 0812   EOSABS 0.1 04/03/2013 1508   EOSABS 0.1 01/29/2011 0812   BASOSABS 0.0 04/03/2013 1508   BASOSABS 0.0 01/29/2011 0812     Chemistry      Component Value Date/Time   NA 132* 04/05/2013 1458   NA 137 10/05/2011 1122   K 4.4 04/05/2013 1458   K 4.5 10/05/2011 1122   CL 98 09/15/2012 1143   CL 101 10/05/2011 1122   CO2 23 04/05/2013 1458   CO2 24 10/05/2011 1122   BUN 27.7* 04/05/2013 1458   BUN 21 10/05/2011 1122   CREATININE 0.8 04/05/2013 1458   CREATININE 0.83 10/05/2011 1122   CREATININE 0.72 12/26/2010 1039      Component Value Date/Time   CALCIUM 8.7 04/05/2013 1458   CALCIUM 9.1 10/05/2011 1122   ALKPHOS 72 04/05/2013 1458   ALKPHOS 68 10/05/2011 1122   AST 24 04/05/2013 1458   AST 21 10/05/2011 1122   ALT 23 04/05/2013 1458   ALT 16 10/05/2011 1122   BILITOT 0.21 04/05/2013 1458   BILITOT 0.3 10/05/2011 1122         Impression:  #1. IgA kappa multiple myeloma  She appears to be responding to single agent Revlimid. Hematologic profile is stable and serum total IgA and free light chains are slowly going down.  I will continue current dose and schedule 10 mg by  mouth 3 weeks on one week rest period   I will monitor her paraprotein levels every 2 months. Routine labs every month.   #2. Plasmacytic right pleural effusion  No clinical recurrence on Revlimid.   #3. Stage I ovarian cancer treated by unilateral oophorectomy May 2009  Her CA 125 tumor marker fluctuates widely and seems to go up whenever she gets an infection and is not correlating very well with any evidence for recurrent ovarian cancer.  Current value down to 34 which is the lowest recent value.  #4. History of severe C. difficile colitis in the past-resolved.   #5. Degenerative arthritis.   #6. Hypothyroid on replacement.   #7. Essential hypertension.   #8. History of pneumonia October 2010 colitis during that admission.   #9. History of a positive PPD. Granulomas on CT scan of the chest. Stable.   #10. Left lower extremity DVT secondary to Revlimid now on  full dose Coumadin anticoagulation.   CC: Patient Care Team: Irven Shelling, MD as PCP - General (Internal Medicine)   Annia Belt, MD 1/2/20156:18 PM

## 2013-04-10 ENCOUNTER — Other Ambulatory Visit: Payer: Self-pay | Admitting: *Deleted

## 2013-04-10 ENCOUNTER — Telehealth: Payer: Self-pay | Admitting: *Deleted

## 2013-04-10 DIAGNOSIS — C9000 Multiple myeloma not having achieved remission: Secondary | ICD-10-CM

## 2013-04-10 LAB — IMMUNOFIXATION ELECTROPHORESIS
IgA: 547 mg/dL — ABNORMAL HIGH (ref 69–380)
IgG (Immunoglobin G), Serum: 564 mg/dL — ABNORMAL LOW (ref 690–1700)
IgM, Serum: 10 mg/dL — ABNORMAL LOW (ref 52–322)
Total Protein, Serum Electrophoresis: 6.2 g/dL (ref 6.0–8.3)

## 2013-04-10 LAB — KAPPA/LAMBDA LIGHT CHAINS
Kappa free light chain: 5.58 mg/dL — ABNORMAL HIGH (ref 0.33–1.94)
Kappa:Lambda Ratio: 3.96 — ABNORMAL HIGH (ref 0.26–1.65)
Lambda Free Lght Chn: 1.41 mg/dL (ref 0.57–2.63)

## 2013-04-10 LAB — CA 125: CA 125: 34.2 U/mL — ABNORMAL HIGH (ref 0.0–30.2)

## 2013-04-10 MED ORDER — LENALIDOMIDE 10 MG PO CAPS: 10.0000 mg | ORAL_CAPSULE | Freq: Every day | ORAL | Status: DC

## 2013-04-10 NOTE — Telephone Encounter (Signed)
THIS REFILL REQUEST FOR REVLIMID WAS PLACED ON DR.GRANFORTUNA'S DESK.

## 2013-04-10 NOTE — Telephone Encounter (Signed)
Reached pt this pm & informed of antibody levels per Dr Azucena Freed request.  She states she trust Dr Beryle Beams. Also informed of refill on revlimid & reminders related to this drug given.

## 2013-04-10 NOTE — Telephone Encounter (Signed)
Message copied by Jesse Fall on Mon Apr 10, 2013  5:17 PM ------      Message from: Annia Belt      Created: Fri Apr 07, 2013  6:35 PM       Call pt: antibody levels about the same as last time: we have reached a plateau which is OK - still down 50% compared with pre Revlimid values ------

## 2013-04-18 ENCOUNTER — Other Ambulatory Visit: Payer: Self-pay | Admitting: *Deleted

## 2013-04-18 DIAGNOSIS — I82409 Acute embolism and thrombosis of unspecified deep veins of unspecified lower extremity: Secondary | ICD-10-CM

## 2013-04-18 MED ORDER — WARFARIN SODIUM 5 MG PO TABS: 5.0000 mg | ORAL_TABLET | Freq: Every day | ORAL | Status: DC

## 2013-04-21 ENCOUNTER — Other Ambulatory Visit: Payer: Medicare Other

## 2013-04-24 ENCOUNTER — Other Ambulatory Visit (HOSPITAL_BASED_OUTPATIENT_CLINIC_OR_DEPARTMENT_OTHER): Payer: Medicare Other

## 2013-04-24 ENCOUNTER — Ambulatory Visit (INDEPENDENT_AMBULATORY_CARE_PROVIDER_SITE_OTHER): Payer: Medicare Other | Admitting: Pharmacist

## 2013-04-24 DIAGNOSIS — I82409 Acute embolism and thrombosis of unspecified deep veins of unspecified lower extremity: Secondary | ICD-10-CM

## 2013-04-24 DIAGNOSIS — I82402 Acute embolism and thrombosis of unspecified deep veins of left lower extremity: Secondary | ICD-10-CM

## 2013-04-24 DIAGNOSIS — C9000 Multiple myeloma not having achieved remission: Secondary | ICD-10-CM

## 2013-04-24 DIAGNOSIS — J90 Pleural effusion, not elsewhere classified: Secondary | ICD-10-CM

## 2013-04-24 LAB — CBC WITH DIFFERENTIAL/PLATELET
BASO%: 1.5 % (ref 0.0–2.0)
Basophils Absolute: 0.1 10*3/uL (ref 0.0–0.1)
EOS%: 2 % (ref 0.0–7.0)
Eosinophils Absolute: 0.1 10*3/uL (ref 0.0–0.5)
HCT: 31.8 % — ABNORMAL LOW (ref 34.8–46.6)
HGB: 10.7 g/dL — ABNORMAL LOW (ref 11.6–15.9)
LYMPH%: 27 % (ref 14.0–49.7)
MCH: 30.6 pg (ref 25.1–34.0)
MCHC: 33.6 g/dL (ref 31.5–36.0)
MCV: 90.9 fL (ref 79.5–101.0)
MONO#: 0.7 10*3/uL (ref 0.1–0.9)
MONO%: 18.2 % — AB (ref 0.0–14.0)
NEUT#: 2 10*3/uL (ref 1.5–6.5)
NEUT%: 51.3 % (ref 38.4–76.8)
Platelets: 178 10*3/uL (ref 145–400)
RBC: 3.5 10*6/uL — AB (ref 3.70–5.45)
RDW: 19.6 % — ABNORMAL HIGH (ref 11.2–14.5)
WBC: 4 10*3/uL (ref 3.9–10.3)
lymph#: 1.1 10*3/uL (ref 0.9–3.3)

## 2013-04-24 LAB — POCT INR: INR: 2.1

## 2013-04-24 LAB — PROTIME-INR
INR: 2.1 (ref 2.00–3.50)
PROTIME: 25.2 s — AB (ref 10.6–13.4)

## 2013-04-25 NOTE — Patient Instructions (Signed)
Continue coumadin 5mg  daily.  Recheck INR on 05/08/13; lab at 11:00am.  The pharmacist will call you to discuss your results.

## 2013-04-25 NOTE — Progress Notes (Signed)
INR within goal today. Pt doing well. No changes to report. No concerns regarding anticoagulation. Continue coumadin 5mg  daily.  Recheck INR on 05/08/13; lab at 11:00am.  No charge encounter - Spoke to patient by telephone.

## 2013-05-03 ENCOUNTER — Other Ambulatory Visit: Payer: Self-pay

## 2013-05-03 DIAGNOSIS — C9000 Multiple myeloma not having achieved remission: Secondary | ICD-10-CM

## 2013-05-03 MED ORDER — POLYSACCHARIDE IRON COMPLEX 150 MG PO CAPS
150.0000 mg | ORAL_CAPSULE | Freq: Two times a day (BID) | ORAL | Status: DC
Start: 2013-05-03 — End: 2013-10-13

## 2013-05-05 ENCOUNTER — Other Ambulatory Visit: Payer: Self-pay | Admitting: *Deleted

## 2013-05-05 ENCOUNTER — Other Ambulatory Visit: Payer: Medicare Other

## 2013-05-05 DIAGNOSIS — C9000 Multiple myeloma not having achieved remission: Secondary | ICD-10-CM

## 2013-05-05 MED ORDER — LENALIDOMIDE 10 MG PO CAPS: 10.0000 mg | ORAL_CAPSULE | Freq: Every day | ORAL | Status: DC

## 2013-05-05 NOTE — Telephone Encounter (Signed)
THIS REFILL REQUEST FOR REVLIMID WAS PLACED IN DR.GRANFORTUNA'S ACTIVE WORK BOX. 

## 2013-05-08 ENCOUNTER — Ambulatory Visit: Payer: Medicare Other | Admitting: Pharmacist

## 2013-05-08 ENCOUNTER — Encounter (INDEPENDENT_AMBULATORY_CARE_PROVIDER_SITE_OTHER): Payer: Self-pay

## 2013-05-08 ENCOUNTER — Other Ambulatory Visit (HOSPITAL_BASED_OUTPATIENT_CLINIC_OR_DEPARTMENT_OTHER): Payer: Medicare Other

## 2013-05-08 DIAGNOSIS — C9000 Multiple myeloma not having achieved remission: Secondary | ICD-10-CM

## 2013-05-08 DIAGNOSIS — I82402 Acute embolism and thrombosis of unspecified deep veins of left lower extremity: Secondary | ICD-10-CM

## 2013-05-08 DIAGNOSIS — I82409 Acute embolism and thrombosis of unspecified deep veins of unspecified lower extremity: Secondary | ICD-10-CM

## 2013-05-08 DIAGNOSIS — C569 Malignant neoplasm of unspecified ovary: Secondary | ICD-10-CM

## 2013-05-08 DIAGNOSIS — J90 Pleural effusion, not elsewhere classified: Secondary | ICD-10-CM

## 2013-05-08 LAB — PROTIME-INR
INR: 1.9 — ABNORMAL LOW (ref 2.00–3.50)
Protime: 22.8 Seconds — ABNORMAL HIGH (ref 10.6–13.4)

## 2013-05-08 LAB — CBC WITH DIFFERENTIAL/PLATELET
BASO%: 1.5 % (ref 0.0–2.0)
Basophils Absolute: 0 10*3/uL (ref 0.0–0.1)
EOS%: 3.8 % (ref 0.0–7.0)
Eosinophils Absolute: 0.1 10*3/uL (ref 0.0–0.5)
HCT: 31.5 % — ABNORMAL LOW (ref 34.8–46.6)
HGB: 10.6 g/dL — ABNORMAL LOW (ref 11.6–15.9)
LYMPH%: 17.4 % (ref 14.0–49.7)
MCH: 31.6 pg (ref 25.1–34.0)
MCHC: 33.6 g/dL (ref 31.5–36.0)
MCV: 94.3 fL (ref 79.5–101.0)
MONO#: 0.6 10*3/uL (ref 0.1–0.9)
MONO%: 17.5 % — ABNORMAL HIGH (ref 0.0–14.0)
NEUT#: 1.9 10*3/uL (ref 1.5–6.5)
NEUT%: 59.8 % (ref 38.4–76.8)
Platelets: 170 10*3/uL (ref 145–400)
RBC: 3.33 10*6/uL — ABNORMAL LOW (ref 3.70–5.45)
RDW: 20 % — AB (ref 11.2–14.5)
WBC: 3.2 10*3/uL — ABNORMAL LOW (ref 3.9–10.3)
lymph#: 0.6 10*3/uL — ABNORMAL LOW (ref 0.9–3.3)

## 2013-05-08 LAB — COMPREHENSIVE METABOLIC PANEL (CC13)
ALT: 33 U/L (ref 0–55)
ANION GAP: 8 meq/L (ref 3–11)
AST: 27 U/L (ref 5–34)
Albumin: 3.5 g/dL (ref 3.5–5.0)
Alkaline Phosphatase: 66 U/L (ref 40–150)
BILIRUBIN TOTAL: 0.3 mg/dL (ref 0.20–1.20)
BUN: 22.5 mg/dL (ref 7.0–26.0)
CO2: 25 mEq/L (ref 22–29)
CREATININE: 0.8 mg/dL (ref 0.6–1.1)
Calcium: 8.8 mg/dL (ref 8.4–10.4)
Chloride: 99 mEq/L (ref 98–109)
Glucose: 82 mg/dl (ref 70–140)
Potassium: 4.5 mEq/L (ref 3.5–5.1)
Sodium: 132 mEq/L — ABNORMAL LOW (ref 136–145)
Total Protein: 6 g/dL — ABNORMAL LOW (ref 6.4–8.3)

## 2013-05-08 LAB — POCT INR: INR: 1.9

## 2013-05-08 NOTE — Patient Instructions (Signed)
INR at goal Continue coumadin 5mg  daily.  Recheck INR on 05/29/13; lab at 11:00am.  The pharmacist will call you to discuss your results.

## 2013-05-08 NOTE — Progress Notes (Signed)
INR right at goal Pt is doing well with no complaints No missed or extra doses No unusual bleeding or bruising No diet or medication changes Plan: Continue coumadin 5mg  daily.  Recheck INR on 05/29/13; lab at 11:00am.  The pharmacist will call you to discuss your results. *Telephone Encounter- No charge*

## 2013-05-10 LAB — IMMUNOFIXATION ELECTROPHORESIS
IGM, SERUM: 9 mg/dL — AB (ref 52–322)
IgA: 508 mg/dL — ABNORMAL HIGH (ref 69–380)
IgG (Immunoglobin G), Serum: 573 mg/dL — ABNORMAL LOW (ref 690–1700)
Total Protein, Serum Electrophoresis: 6 g/dL (ref 6.0–8.3)

## 2013-05-10 LAB — KAPPA/LAMBDA LIGHT CHAINS
KAPPA LAMBDA RATIO: 3.47 — AB (ref 0.26–1.65)
Kappa free light chain: 3.12 mg/dL — ABNORMAL HIGH (ref 0.33–1.94)
LAMBDA FREE LGHT CHN: 0.9 mg/dL (ref 0.57–2.63)

## 2013-05-11 ENCOUNTER — Telehealth: Payer: Self-pay | Admitting: *Deleted

## 2013-05-11 NOTE — Telephone Encounter (Signed)
Notified pt that antibody lefels cont to slowly improve per Dr Azucena Freed instructions.  She was very pleased.

## 2013-05-11 NOTE — Telephone Encounter (Signed)
Message copied by Jesse Fall on Thu May 11, 2013 10:24 AM ------      Message from: Annia Belt      Created: Wed May 10, 2013 10:55 AM       Call pt: antibody levels continue to slowly improve ------

## 2013-05-29 ENCOUNTER — Other Ambulatory Visit (HOSPITAL_BASED_OUTPATIENT_CLINIC_OR_DEPARTMENT_OTHER): Payer: Medicare Other

## 2013-05-29 DIAGNOSIS — J90 Pleural effusion, not elsewhere classified: Secondary | ICD-10-CM

## 2013-05-29 DIAGNOSIS — C9000 Multiple myeloma not having achieved remission: Secondary | ICD-10-CM

## 2013-05-29 DIAGNOSIS — I82402 Acute embolism and thrombosis of unspecified deep veins of left lower extremity: Secondary | ICD-10-CM

## 2013-05-29 DIAGNOSIS — I82409 Acute embolism and thrombosis of unspecified deep veins of unspecified lower extremity: Secondary | ICD-10-CM

## 2013-05-29 LAB — CBC WITH DIFFERENTIAL/PLATELET
BASO%: 1.5 % (ref 0.0–2.0)
BASOS ABS: 0.1 10*3/uL (ref 0.0–0.1)
EOS ABS: 0.1 10*3/uL (ref 0.0–0.5)
EOS%: 2.3 % (ref 0.0–7.0)
HCT: 32.8 % — ABNORMAL LOW (ref 34.8–46.6)
HEMOGLOBIN: 10.8 g/dL — AB (ref 11.6–15.9)
LYMPH%: 16.5 % (ref 14.0–49.7)
MCH: 31.6 pg (ref 25.1–34.0)
MCHC: 33 g/dL (ref 31.5–36.0)
MCV: 95.9 fL (ref 79.5–101.0)
MONO#: 0.3 10*3/uL (ref 0.1–0.9)
MONO%: 8.6 % (ref 0.0–14.0)
NEUT%: 71.1 % (ref 38.4–76.8)
NEUTROS ABS: 2.4 10*3/uL (ref 1.5–6.5)
Platelets: 211 10*3/uL (ref 145–400)
RBC: 3.42 10*6/uL — ABNORMAL LOW (ref 3.70–5.45)
RDW: 20 % — AB (ref 11.2–14.5)
WBC: 3.4 10*3/uL — ABNORMAL LOW (ref 3.9–10.3)
lymph#: 0.6 10*3/uL — ABNORMAL LOW (ref 0.9–3.3)

## 2013-05-29 LAB — PROTIME-INR
INR: 2.1 (ref 2.00–3.50)
Protime: 25.2 Seconds — ABNORMAL HIGH (ref 10.6–13.4)

## 2013-05-31 ENCOUNTER — Other Ambulatory Visit: Payer: Self-pay | Admitting: *Deleted

## 2013-06-01 ENCOUNTER — Other Ambulatory Visit: Payer: Self-pay | Admitting: *Deleted

## 2013-06-01 DIAGNOSIS — C9000 Multiple myeloma not having achieved remission: Secondary | ICD-10-CM

## 2013-06-01 MED ORDER — LENALIDOMIDE 10 MG PO CAPS: 10.0000 mg | ORAL_CAPSULE | Freq: Every day | ORAL | Status: DC

## 2013-06-02 ENCOUNTER — Ambulatory Visit (HOSPITAL_BASED_OUTPATIENT_CLINIC_OR_DEPARTMENT_OTHER): Payer: Medicare Other | Admitting: Oncology

## 2013-06-02 ENCOUNTER — Telehealth: Payer: Self-pay | Admitting: Oncology

## 2013-06-02 VITALS — BP 174/87 | HR 72 | Temp 97.5°F | Resp 17 | Ht 63.0 in | Wt 132.1 lb

## 2013-06-02 DIAGNOSIS — C569 Malignant neoplasm of unspecified ovary: Secondary | ICD-10-CM

## 2013-06-02 DIAGNOSIS — I82409 Acute embolism and thrombosis of unspecified deep veins of unspecified lower extremity: Secondary | ICD-10-CM

## 2013-06-02 DIAGNOSIS — J90 Pleural effusion, not elsewhere classified: Secondary | ICD-10-CM

## 2013-06-02 DIAGNOSIS — C9000 Multiple myeloma not having achieved remission: Secondary | ICD-10-CM

## 2013-06-02 NOTE — Telephone Encounter (Signed)
Gave pt appt for labs every month and see Dr Benay Spice on  [facility] 2014

## 2013-06-03 ENCOUNTER — Encounter: Payer: Self-pay | Admitting: Oncology

## 2013-06-04 NOTE — Progress Notes (Signed)
Hematology and Oncology Follow Up Visit  Heidi Pratt [id number] 1918-09-09 80 y.o. 06/04/2013 9:25 AM   Principle Diagnosis: Encounter Diagnoses  Name Primary?  . Multiple myeloma   . Ovarian epithelial cancer Yes  . Multiple myeloma, without mention of having achieved remission      Interim History:  Followup visit for this delightful 80 year old woman under active treatment for IgA kappa multiple myeloma initially diagnosedin November 2012 when she presented with a mild normochromic anemia. She was initially treated with a combination of oral Alkeran and prednisone through April of 2013. She had poor tolerance to the steroids. She did not respond to this combination although hematologic profile remained stable. She was given a treatment holiday. Total serum IgA and serum free light chains began to rise concomitant with the development of a symptomatic right pleural effusion. A thoracentesis was done on 09/05/2012 and unexpectedly revealed a plasma cell infiltrate. She was started back on treatment with single agent Revlimid 10 mg by mouth 21 days on 7 day rest without any steroids beginning in mid July. She experienced initial significant fatigue from the drug but no other toxicities and I encouraged her to stay on it. She held Revlimid in August due to an intercurrent bronchitis but has been back on the drug reliably since September. We are now seeing a slow, but steady, improvement in her IgA level and her kappa serum free light chains with decrease in the total IgA from 984 mg percent in June to  value of 564 mg percent on 04/05/2013. Value 2 weeks ago slightly higher at 573 on 05/08/2013.  Fall in kappa free light chains from 10.6 mg percent to 3.12 mg percent, kappa/lambda ratio 3.47. CBC remains stable with hemoglobin 10.8, white count 3400, platelets 211,000. (February 23rd 2015). Followup bone survey done 03/06/2013 shows changes from previous left hip replacement. No lytic or sclerotic  bone lesions.  She also has a history of stage I ovarian cancer treated with unilateral laparoscopic oophorectomy in May 2009. She does have wide fluctuations in Ca-125 tumor marker which usually goes up when she gets an acute infectious process and does not correlate with any signs of recurrent ovarian cancer.  She's had no interim medical problems. No infections. Difficulty ambulating due to osteoarthritis. No abdominal swelling or leg swelling. No abdominal pain. No vaginal bleeding.  Her physician son accompanies her again on today's visit.   Medications: reviewed  Allergies:  Allergies  Allergen Reactions  . Amoxicillin Diarrhea  . Crab [Shellfish Allergy] Nausea And Vomiting    Review of Systems: Hematology:  No bleeding or bruising ENT ROS: No sore throat Breast ROS:  Respiratory ROS: No cough or dyspnea Cardiovascular ROS:  No chest pain or palpitations Gastrointestinal ROS:  See above  Genito-Urinary ROS: See above Musculoskeletal ROS: See above Neurological ROS: No headache or change in vision. No paresthesias. Dermatological ROS: No rash or ecchymosis Remaining ROS negative:   Physical Exam: Blood pressure 174/87, pulse 72, temperature 97.5 F (36.4 C), temperature source Oral, resp. rate 17, height $RemoveBe'5\' 3"'IJZcXptDk$  (1.6 m), weight 132 lb 1.6 oz (59.92 kg), SpO2 96.00%. Wt Readings from Last 3 Encounters:  06/02/13 132 lb 1.6 oz (59.92 kg)  04/07/13 128 lb 8 oz (58.287 kg)  02/03/13 127 lb 9.6 oz (57.879 kg)     General appearance: Well-nourished Caucasian woman HENNT: Pharynx no erythema, exudate, mass, or ulcer. No thyromegaly or thyroid nodules Lymph nodes: No cervical, supraclavicular, or axillary lymphadenopathy Breasts:  Lungs: Clear  to auscultation, resonant to percussion throughout Heart: Regular rhythm, no murmur, no gallop, no rub, no click, no edema Abdomen: Soft, and chronically distended, nontender, normal bowel sounds, no mass, no organomegaly, no fluid  wave Extremities: No edema, no calf tenderness Musculoskeletal: Pronounced osteoarthritic joint deformities of her fingers GU:  Vascular: Carotid pulses 2+, no bruits,  Neurologic: Alert, oriented, PERRLA,  , cranial nerves grossly normal, motor strength 5 over 5, reflexes 1+ symmetric, upper body coordination normal, gait normal, Skin: No rash or ecchymosis  Lab Results: CBC W/Diff    Component Value Date/Time   WBC 3.4* 05/29/2013 1059   WBC 5.2 01/29/2011 0812   RBC 3.42* 05/29/2013 1059   RBC 3.44* 01/29/2011 0812   HGB 10.8* 05/29/2013 1059   HGB 11.1* 01/29/2011 0812   HCT 32.8* 05/29/2013 1059   HCT 31.9* 01/29/2011 0812   PLT 211 05/29/2013 1059   PLT 235 01/29/2011 0812   MCV 95.9 05/29/2013 1059   MCV 92.7 01/29/2011 0812   MCH 31.6 05/29/2013 1059   MCH 32.3 01/29/2011 0812   MCHC 33.0 05/29/2013 1059   MCHC 34.8 01/29/2011 0812   RDW 20.0* 05/29/2013 1059   RDW 16.3* 01/29/2011 0812   LYMPHSABS 0.6* 05/29/2013 1059   LYMPHSABS 1.5 01/29/2011 0812   MONOABS 0.3 05/29/2013 1059   MONOABS 0.6 01/29/2011 0812   EOSABS 0.1 05/29/2013 1059   EOSABS 0.1 01/29/2011 0812   BASOSABS 0.1 05/29/2013 1059   BASOSABS 0.0 01/29/2011 0812     Chemistry      Component Value Date/Time   NA 132* 05/08/2013 1114   NA 137 10/05/2011 1122   K 4.5 05/08/2013 1114   K 4.5 10/05/2011 1122   CL 98 09/15/2012 1143   CL 101 10/05/2011 1122   CO2 25 05/08/2013 1114   CO2 24 10/05/2011 1122   BUN 22.5 05/08/2013 1114   BUN 21 10/05/2011 1122   CREATININE 0.8 05/08/2013 1114   CREATININE 0.83 10/05/2011 1122   CREATININE 0.72 12/26/2010 1039      Component Value Date/Time   CALCIUM 8.8 05/08/2013 1114   CALCIUM 9.1 10/05/2011 1122   ALKPHOS 66 05/08/2013 1114   ALKPHOS 68 10/05/2011 1122   AST 27 05/08/2013 1114   AST 21 10/05/2011 1122   ALT 33 05/08/2013 1114   ALT 16 10/05/2011 1122   BILITOT 0.30 05/08/2013 1114   BILITOT 0.3 10/05/2011 1122     Impression:  #1. IgA kappa multiple myeloma  She is responding to single  agent Revlimid. Hematologic profile is stable and serum total IgA and free light chains are slowly going down but likely at a plateau.  I will continue current dose and schedule 10 mg by mouth 3 weeks on one week rest period  Continued to monitor her paraprotein levels every 2 months. Routine labs every month.   #2. Plasmacytic right pleural effusion  No clinical recurrence on Revlimid.   #3. Stage I ovarian cancer treated by unilateral oophorectomy May 2009    #4. History of severe C. difficile colitis in the past-resolved.   #5. Degenerative arthritis.  #6. Hypothyroid on replacement.  #7. Essential hypertension.  #8. History of pneumonia October 2010 colitis during that admission.  #9. History of a positive PPD. Granulomas on CT scan of the chest. Stable.  #10. Left lower extremity DVT secondary to Revlimid now on full dose Coumadin anticoagulation  I will transition her care to Dr. Benay Spice    CC: Patient Care Team: Darlina Rumpf  Laurann Montana, MD as PCP - General (Internal Medicine)   Annia Belt, MD 3/1/20159:25 AM

## 2013-06-07 ENCOUNTER — Telehealth: Payer: Self-pay | Admitting: *Deleted

## 2013-06-07 NOTE — Telephone Encounter (Signed)
Patient calls wanting some direction with her right ankle swelling.  Dr. Beryle Beams saw it with her visit last week.  It is unchanged- no pain, no discoloration or redness, no heat.   Discussed with Dr. Beryle Beams.  She is therapeutic on her coumadin.  He did not see anything of concern.  He thinks at her age [age] that some of her valves in her veins are not functioning as well as previously.   Called patient back and she is totally satisfied with this.  She knows to call us for problems that arise before her next appt.

## 2013-06-12 ENCOUNTER — Ambulatory Visit: Payer: Medicare Other | Admitting: Pharmacist

## 2013-06-12 ENCOUNTER — Other Ambulatory Visit (HOSPITAL_BASED_OUTPATIENT_CLINIC_OR_DEPARTMENT_OTHER): Payer: Medicare Other

## 2013-06-12 ENCOUNTER — Ambulatory Visit: Payer: Medicare Other

## 2013-06-12 DIAGNOSIS — I82402 Acute embolism and thrombosis of unspecified deep veins of left lower extremity: Secondary | ICD-10-CM

## 2013-06-12 DIAGNOSIS — I82409 Acute embolism and thrombosis of unspecified deep veins of unspecified lower extremity: Secondary | ICD-10-CM

## 2013-06-12 DIAGNOSIS — C9000 Multiple myeloma not having achieved remission: Secondary | ICD-10-CM

## 2013-06-12 LAB — POCT INR: INR: 2.3

## 2013-06-12 LAB — PROTIME-INR
INR: 2.3 (ref 2.00–3.50)
PROTIME: 27.6 s — AB (ref 10.6–13.4)

## 2013-06-12 NOTE — Progress Notes (Signed)
Telephone Encounter *No Charge* INR at goal today (2.3) Patient has been very stable on current dose Called pt this afternoon to discuss INR results Unable to reach patient but left voicemail with instructions to call our clinic with any concerns or questions Patient can continue coumadin 5mg  daily.  She is scheduled to return for lab in 3 weeks Recheck INR on 07/03/13; lab at 11:00am.

## 2013-06-28 ENCOUNTER — Other Ambulatory Visit: Payer: Self-pay | Admitting: *Deleted

## 2013-06-28 DIAGNOSIS — C9000 Multiple myeloma not having achieved remission: Secondary | ICD-10-CM

## 2013-06-28 MED ORDER — LENALIDOMIDE 10 MG PO CAPS: 10.0000 mg | ORAL_CAPSULE | Freq: Every day | ORAL | Status: DC

## 2013-07-03 ENCOUNTER — Other Ambulatory Visit (HOSPITAL_BASED_OUTPATIENT_CLINIC_OR_DEPARTMENT_OTHER): Payer: Medicare Other

## 2013-07-03 ENCOUNTER — Ambulatory Visit (INDEPENDENT_AMBULATORY_CARE_PROVIDER_SITE_OTHER): Payer: Medicare Other | Admitting: Pharmacist

## 2013-07-03 DIAGNOSIS — C569 Malignant neoplasm of unspecified ovary: Secondary | ICD-10-CM

## 2013-07-03 DIAGNOSIS — I82409 Acute embolism and thrombosis of unspecified deep veins of unspecified lower extremity: Secondary | ICD-10-CM

## 2013-07-03 DIAGNOSIS — I82402 Acute embolism and thrombosis of unspecified deep veins of left lower extremity: Secondary | ICD-10-CM

## 2013-07-03 DIAGNOSIS — C9000 Multiple myeloma not having achieved remission: Secondary | ICD-10-CM

## 2013-07-03 LAB — COMPREHENSIVE METABOLIC PANEL (CC13)
ALK PHOS: 91 U/L (ref 40–150)
ALT: 45 U/L (ref 0–55)
ANION GAP: 9 meq/L (ref 3–11)
AST: 32 U/L (ref 5–34)
Albumin: 3.3 g/dL — ABNORMAL LOW (ref 3.5–5.0)
BILIRUBIN TOTAL: 0.29 mg/dL (ref 0.20–1.20)
BUN: 15.2 mg/dL (ref 7.0–26.0)
CO2: 23 mEq/L (ref 22–29)
Calcium: 8.6 mg/dL (ref 8.4–10.4)
Chloride: 102 mEq/L (ref 98–109)
Creatinine: 0.7 mg/dL (ref 0.6–1.1)
GLUCOSE: 83 mg/dL (ref 70–140)
Potassium: 4 mEq/L (ref 3.5–5.1)
Sodium: 134 mEq/L — ABNORMAL LOW (ref 136–145)
Total Protein: 6.2 g/dL — ABNORMAL LOW (ref 6.4–8.3)

## 2013-07-03 LAB — CBC WITH DIFFERENTIAL/PLATELET
BASO%: 0.3 % (ref 0.0–2.0)
BASOS ABS: 0 10*3/uL (ref 0.0–0.1)
EOS%: 2 % (ref 0.0–7.0)
Eosinophils Absolute: 0.1 10*3/uL (ref 0.0–0.5)
HEMATOCRIT: 29.7 % — AB (ref 34.8–46.6)
HEMOGLOBIN: 10.1 g/dL — AB (ref 11.6–15.9)
LYMPH%: 16.2 % (ref 14.0–49.7)
MCH: 32 pg (ref 25.1–34.0)
MCHC: 34 g/dL (ref 31.5–36.0)
MCV: 94 fL (ref 79.5–101.0)
MONO#: 0.5 10*3/uL (ref 0.1–0.9)
MONO%: 12.9 % (ref 0.0–14.0)
NEUT#: 2.5 10*3/uL (ref 1.5–6.5)
NEUT%: 68.6 % (ref 38.4–76.8)
PLATELETS: 189 10*3/uL (ref 145–400)
RBC: 3.16 10*6/uL — ABNORMAL LOW (ref 3.70–5.45)
RDW: 18.4 % — ABNORMAL HIGH (ref 11.2–14.5)
WBC: 3.6 10*3/uL — ABNORMAL LOW (ref 3.9–10.3)
lymph#: 0.6 10*3/uL — ABNORMAL LOW (ref 0.9–3.3)
nRBC: 1 % — ABNORMAL HIGH (ref 0–0)

## 2013-07-03 LAB — CA 125: CA 125: 37.4 U/mL — ABNORMAL HIGH (ref 0.0–30.2)

## 2013-07-03 LAB — POCT INR: INR: 2.4

## 2013-07-03 LAB — PROTIME-INR
INR: 2.4 (ref 2.00–3.50)
Protime: 28.8 Seconds — ABNORMAL HIGH (ref 10.6–13.4)

## 2013-07-03 NOTE — Progress Notes (Signed)
INR = 2.4 on Coumadin 5 mg daily. Only complaint: pt feels "droopy & dreadful" from switching to generic anti-hypertensive.  She just wants to go back to bed.  This is "not me." No bleeding or bruising. INR therapeutic.  No change to Coumadin. Pt will talk to "druggist" about going back to brand BP med. Repeat INR on 07/31/13 & we'll call her w/ results & dosing instructions. NO CHARGE- phone encounter Kennith Center, Pharm.D., CPP 07/03/2013@3 :31 PM

## 2013-07-06 ENCOUNTER — Telehealth: Payer: Self-pay | Admitting: *Deleted

## 2013-07-06 NOTE — Telephone Encounter (Signed)
Message copied by Ignacia Felling on Thu Jul 06, 2013 11:33 AM ------      Message from: Annia Belt      Created: Wed Jul 05, 2013  9:12 AM       OK to call pt w results - Ca125 mildly elevated but well within range of previous values and not to worry.  She sees Dr Benay Spice on 5/1 ------

## 2013-07-06 NOTE — Telephone Encounter (Signed)
Spoke with patient and let her know CA 125 is mildly elevated but well within range of previous values (gave her those also) so not to worry.  She knows she will see Dr. Ammie Dalton on 5/1.  She appreciated the phone call.

## 2013-07-28 ENCOUNTER — Other Ambulatory Visit: Payer: Medicare Other

## 2013-07-28 ENCOUNTER — Other Ambulatory Visit: Payer: Self-pay | Admitting: *Deleted

## 2013-07-28 NOTE — Telephone Encounter (Signed)
THIS REFILL REQUEST FOR REVLIMID WAS GIVEN TO DR.SHERRILL'S NURSE, TANYA WHITLOCK,RN.

## 2013-07-31 ENCOUNTER — Other Ambulatory Visit (HOSPITAL_BASED_OUTPATIENT_CLINIC_OR_DEPARTMENT_OTHER): Payer: Medicare Other

## 2013-07-31 ENCOUNTER — Ambulatory Visit (INDEPENDENT_AMBULATORY_CARE_PROVIDER_SITE_OTHER): Payer: Medicare Other | Admitting: Pharmacist

## 2013-07-31 DIAGNOSIS — C9000 Multiple myeloma not having achieved remission: Secondary | ICD-10-CM

## 2013-07-31 DIAGNOSIS — I82409 Acute embolism and thrombosis of unspecified deep veins of unspecified lower extremity: Secondary | ICD-10-CM

## 2013-07-31 DIAGNOSIS — I82402 Acute embolism and thrombosis of unspecified deep veins of left lower extremity: Secondary | ICD-10-CM

## 2013-07-31 LAB — CBC WITH DIFFERENTIAL/PLATELET
BASO%: 0.5 % (ref 0.0–2.0)
BASOS ABS: 0 10*3/uL (ref 0.0–0.1)
EOS%: 2.5 % (ref 0.0–7.0)
Eosinophils Absolute: 0.1 10*3/uL (ref 0.0–0.5)
HCT: 30.1 % — ABNORMAL LOW (ref 34.8–46.6)
HEMOGLOBIN: 10.2 g/dL — AB (ref 11.6–15.9)
LYMPH#: 0.7 10*3/uL — AB (ref 0.9–3.3)
LYMPH%: 16 % (ref 14.0–49.7)
MCH: 31.9 pg (ref 25.1–34.0)
MCHC: 33.9 g/dL (ref 31.5–36.0)
MCV: 94.1 fL (ref 79.5–101.0)
MONO#: 0.6 10*3/uL (ref 0.1–0.9)
MONO%: 13.6 % (ref 0.0–14.0)
NEUT#: 2.7 10*3/uL (ref 1.5–6.5)
NEUT%: 67.4 % (ref 38.4–76.8)
Platelets: 176 10*3/uL (ref 145–400)
RBC: 3.2 10*6/uL — ABNORMAL LOW (ref 3.70–5.45)
RDW: 17.6 % — AB (ref 11.2–14.5)
WBC: 4.1 10*3/uL (ref 3.9–10.3)
nRBC: 0 % (ref 0–0)

## 2013-07-31 LAB — COMPREHENSIVE METABOLIC PANEL (CC13)
ALK PHOS: 79 U/L (ref 40–150)
ALT: 21 U/L (ref 0–55)
AST: 25 U/L (ref 5–34)
Albumin: 3.2 g/dL — ABNORMAL LOW (ref 3.5–5.0)
Anion Gap: 8 mEq/L (ref 3–11)
BUN: 18.8 mg/dL (ref 7.0–26.0)
CHLORIDE: 102 meq/L (ref 98–109)
CO2: 24 mEq/L (ref 22–29)
Calcium: 9 mg/dL (ref 8.4–10.4)
Creatinine: 0.8 mg/dL (ref 0.6–1.1)
Glucose: 91 mg/dl (ref 70–140)
POTASSIUM: 4.4 meq/L (ref 3.5–5.1)
SODIUM: 134 meq/L — AB (ref 136–145)
TOTAL PROTEIN: 6.3 g/dL — AB (ref 6.4–8.3)
Total Bilirubin: 0.35 mg/dL (ref 0.20–1.20)

## 2013-07-31 LAB — PROTIME-INR
INR: 3 (ref 2.00–3.50)
Protime: 36 Seconds — ABNORMAL HIGH (ref 10.6–13.4)

## 2013-07-31 LAB — POCT INR: INR: 3

## 2013-07-31 NOTE — Progress Notes (Signed)
LVM with patient regarding INR she had drawn in lab today INR=3.0 on 5mg  daily Will not change dose RTC in one month 08/29/13 lab at 10:00 and CC at 10:15

## 2013-07-31 NOTE — Patient Instructions (Signed)
Continue coumadin 5mg  daily.  Recheck INR on 08/29/13; lab at 10:00am.  The pharmacist will call you to discuss your results

## 2013-08-02 LAB — IMMUNOFIXATION ELECTROPHORESIS
IgA: 523 mg/dL — ABNORMAL HIGH (ref 69–380)
IgG (Immunoglobin G), Serum: 476 mg/dL — ABNORMAL LOW (ref 690–1700)
IgM, Serum: 6 mg/dL — ABNORMAL LOW (ref 52–322)
TOTAL PROTEIN, SERUM ELECTROPHOR: 5.8 g/dL — AB (ref 6.0–8.3)

## 2013-08-02 LAB — KAPPA/LAMBDA LIGHT CHAINS
KAPPA FREE LGHT CHN: 3.52 mg/dL — AB (ref 0.33–1.94)
KAPPA LAMBDA RATIO: 2.27 — AB (ref 0.26–1.65)
Lambda Free Lght Chn: 1.55 mg/dL (ref 0.57–2.63)

## 2013-08-04 ENCOUNTER — Other Ambulatory Visit: Payer: Self-pay | Admitting: Medical Oncology

## 2013-08-04 ENCOUNTER — Other Ambulatory Visit: Payer: Medicare Other

## 2013-08-04 ENCOUNTER — Ambulatory Visit (HOSPITAL_BASED_OUTPATIENT_CLINIC_OR_DEPARTMENT_OTHER): Payer: Medicare Other | Admitting: Oncology

## 2013-08-04 ENCOUNTER — Telehealth: Payer: Self-pay | Admitting: Oncology

## 2013-08-04 ENCOUNTER — Telehealth: Payer: Self-pay | Admitting: Medical Oncology

## 2013-08-04 ENCOUNTER — Encounter: Payer: Self-pay | Admitting: Oncology

## 2013-08-04 VITALS — BP 164/74 | HR 95 | Temp 97.8°F | Resp 17 | Ht 63.0 in | Wt 126.1 lb

## 2013-08-04 DIAGNOSIS — I82409 Acute embolism and thrombosis of unspecified deep veins of unspecified lower extremity: Secondary | ICD-10-CM

## 2013-08-04 DIAGNOSIS — I10 Essential (primary) hypertension: Secondary | ICD-10-CM

## 2013-08-04 DIAGNOSIS — Z8543 Personal history of malignant neoplasm of ovary: Secondary | ICD-10-CM

## 2013-08-04 DIAGNOSIS — C9000 Multiple myeloma not having achieved remission: Secondary | ICD-10-CM

## 2013-08-04 DIAGNOSIS — J90 Pleural effusion, not elsewhere classified: Secondary | ICD-10-CM

## 2013-08-04 DIAGNOSIS — Z7901 Long term (current) use of anticoagulants: Secondary | ICD-10-CM

## 2013-08-04 DIAGNOSIS — E039 Hypothyroidism, unspecified: Secondary | ICD-10-CM

## 2013-08-04 MED ORDER — LENALIDOMIDE 10 MG PO CAPS: 10.0000 mg | ORAL_CAPSULE | Freq: Every day | ORAL | Status: DC

## 2013-08-04 NOTE — Telephone Encounter (Signed)
faxed

## 2013-08-04 NOTE — Telephone Encounter (Signed)
revlimid refill faxed to [facility]

## 2013-08-04 NOTE — Telephone Encounter (Signed)
Gave pt appt for labs for may and June , emailed Dr. Benay Spice  regarding MD spot , pty does not want to come @ 0830

## 2013-08-04 NOTE — Patient Instructions (Signed)

## 2013-08-04 NOTE — Progress Notes (Signed)
  [facility] OFFICE PROGRESS NOTE   Diagnosis: Multiple myeloma  INTERVAL HISTORY:   She has been followed by Dr. Beryle Beams since being diagnosed with multiple myeloma in November of 2012. She began single agent Revlimid in July of 2014. The IgA level has improved on Revlimid.  She reports feeling well. She has mild low back discomfort and "arthritis "pain. No nausea or diarrhea. Occasional numbness in the fingertips. She has noted a mild increase in exertional dyspnea recently. No fever or cough.  She will complete the current cycle of Revlimid 08/07/2013.  Objective:  Vital signs in last 24 hours:  Blood pressure 164/74, pulse 95, temperature 97.8 F (36.6 C), temperature source Oral, resp. rate 17, height $RemoveBe'5\' 3"'uOlptgdOd$  (1.6 m), weight 126 lb 1.6 oz (57.199 kg), SpO2 95.00%.    HEENT: No thrush or ulcers Resp: Scattered coarse rhonchi at the left greater than right chest, no respiratory distress, good air movement bilaterally Cardio: Regular rate and rhythm GI: No hepatosplenomegaly Vascular: No leg edema  Skin: Changes of senile purpura at the lower legs     Lab Results:  Lab Results  Component Value Date   WBC 4.1 07/31/2013   HGB 10.2* 07/31/2013   HCT 30.1* 07/31/2013   MCV 94.1 07/31/2013   PLT 176 07/31/2013   NEUTROABS 2.7 07/31/2013   PT-INR 3.0  IgA 523, kappa free light chains 3.52  Medications: I have reviewed the patient's current medications.  Assessment/Plan:  1. IgA kappa multiple myeloma diagnosed in November 2012, initially treated with melphalan/prednisone through April of 2013 without a response  Initiation of single agent Revlimid July 2014, cycle on a 3 week on/one-week off schedule  2. Right pleural effusion, numerous plasmacytoid cells on cytology June 2014  3. Stage I ovarian cancer treated with a unilateral oophorectomy in may 2009  4. History of C. difficile colitis  5. Degenerative arthritis  6. Hypothyroidism  7.  Hypertension  8. Pneumonia October 2010  9. History of a positive PPD with granulomas on chest CT  10. Left lower extremity DVT July 2014-maintained on Coumadin  Disposition:  She appears to be tolerating the Revlimid well. The IgA level and hemoglobin are stable. The plan is to continue Revlimid on a 3 week on/one-week off schedule. She continues Coumadin anticoagulation with followup at the [facility]. No clinical evidence for recurrence of the right pleural effusion. She will contact us for a cough, fever, or increased dyspnea.  She will return for an office visit in 2 months.  Ladell Pier, MD  08/04/2013  12:14 PM

## 2013-08-08 ENCOUNTER — Encounter: Payer: Self-pay | Admitting: Oncology

## 2013-08-08 ENCOUNTER — Telehealth: Payer: Self-pay | Admitting: *Deleted

## 2013-08-08 NOTE — Telephone Encounter (Signed)
VM from patient reporting MD wanted to see her again in July, but was told by scheduler MD has nothing until August. Requesting July follow up.

## 2013-08-08 NOTE — Progress Notes (Signed)
[facility], [phone] opt 3 opt 2, per rep they have a pa for revlimid until 09/21/13

## 2013-08-10 ENCOUNTER — Other Ambulatory Visit: Payer: Self-pay | Admitting: Internal Medicine

## 2013-08-10 ENCOUNTER — Ambulatory Visit
Admission: RE | Admit: 2013-08-10 | Discharge: 2013-08-10 | Disposition: A | Discharge status: Home / Self Care | Payer: Medicare Other | Source: Ambulatory Visit | Attending: Internal Medicine | Admitting: Internal Medicine

## 2013-08-10 DIAGNOSIS — R05 Cough: Secondary | ICD-10-CM

## 2013-08-10 DIAGNOSIS — R059 Cough, unspecified: Secondary | ICD-10-CM

## 2013-08-17 ENCOUNTER — Encounter: Payer: Self-pay | Admitting: Geriatric Medicine

## 2013-08-17 ENCOUNTER — Non-Acute Institutional Stay (SKILLED_NURSING_FACILITY): Payer: Medicare Other | Admitting: Geriatric Medicine

## 2013-08-17 ENCOUNTER — Telehealth: Payer: Self-pay | Admitting: Oncology

## 2013-08-17 DIAGNOSIS — C9000 Multiple myeloma not having achieved remission: Secondary | ICD-10-CM

## 2013-08-17 DIAGNOSIS — E876 Hypokalemia: Secondary | ICD-10-CM

## 2013-08-17 DIAGNOSIS — R5381 Other malaise: Secondary | ICD-10-CM

## 2013-08-17 DIAGNOSIS — E871 Hypo-osmolality and hyponatremia: Secondary | ICD-10-CM

## 2013-08-17 DIAGNOSIS — J209 Acute bronchitis, unspecified: Secondary | ICD-10-CM | POA: Insufficient documentation

## 2013-08-17 DIAGNOSIS — Z7901 Long term (current) use of anticoagulants: Secondary | ICD-10-CM

## 2013-08-17 LAB — BASIC METABOLIC PANEL
BUN: 10 mg/dL (ref 4–21)
Creatinine: 0.5 mg/dL (ref 0.5–1.1)
GLUCOSE: 87 mg/dL
POTASSIUM: 2.9 mmol/L — AB (ref 3.4–5.3)
SODIUM: 120 mmol/L — AB (ref 137–147)

## 2013-08-17 NOTE — Progress Notes (Signed)
Patient ID: Heidi Pratt, female   DOB: 1918-06-01, 80 y.o.   MRN: [id number]   [facility] [phone])  Code Status: Full Code, Living Will  Contact Information   Name Relation Home Work Mobile   [place] Son [phone]  [phone]   Lumbert,von Daughter [phone]     Calin, Ellery [phone]          HPI: This is a 80 y.o. female resident of [place] [place] [facility] admitted to the rehabilitation section last evening due to progressive weakness following upper respiratory infection. She was evaluated by her primary care provider last week because of a sore throat that progressed to cough. She was treated with azithromycin. Cough did not improve, was not well enough to celebrate her birthday last week. Prior to rehabilitation admission she had not been eating and drinking well for the last few days, reported several days of diarrhea and had not slept well for the prior 4 nights due to coughing.   On Rehab admission albuterol neb treatments were ordered; patient reports this helped her cough tremendously; she was able to sleep during the night. She has been able to eat/drink small amounts today without GI complaints. Chest x-ray was ordered last evening and returned without evidence of acute pulmonary disease. BMP from this morning shows significant hypo-natremia and hypokalemia. Patient's other main health issue is multiple myeloma. She is followed by Dr. Benay Spice, treated with Nevlimid on a 3 week on 1 week off schedule. She was to restart this medication on Monday, May 11 but did not since she was feeling ill. Dr. Learta Codding has recommended holding this medication until she's feeling better.    Allergies  Allergen Reactions  . Amoxicillin Diarrhea  . Crab [Shellfish Allergy] Nausea And Vomiting     MEDICATIONS -     Medication List       This list is accurate as of: 08/17/13  5:52 PM.  Always  use your most recent med list.               alendronate 70 MG tablet  Commonly known as:  FOSAMAX  Take 70 mg by mouth every 7 (seven) days. Take with a full glass of water on an empty stomach.     amLODipine 2.5 MG tablet  Commonly known as:  NORVASC  Take 2.5 mg by mouth daily.     calcium-vitamin D 250-125 MG-UNIT per tablet  Commonly known as:  OSCAL WITH D  Take 1 tablet by mouth daily.     CENTRUM SILVER PO  Take by mouth daily.     DIOVAN PO  Take 120 mg by mouth daily.     glucosamine-chondroitin 500-400 MG tablet  Take 1 tablet by mouth daily.     iron polysaccharides 150 MG capsule  Commonly known as:  FERREX 150  Take 1 capsule (150 mg total) by mouth 2 (two) times daily.     lenalidomide 10 MG capsule  Commonly known as:  REVLIMID  Take 1 capsule (10 mg total) by mouth daily. One po for 21 days followed by 7 day rest.     levothyroxine 88 MCG tablet  Commonly known as:  SYNTHROID, LEVOTHROID  Take 88 mcg by mouth daily before breakfast.     nebivolol 10 MG tablet  Commonly known as:  BYSTOLIC  Take 10 mg by mouth daily.     psyllium 58.6 % powder  Commonly known as:  METAMUCIL  Take 1 packet by  mouth daily as needed.     warfarin 5 MG tablet  Commonly known as:  COUMADIN  Take 1 tablet (5 mg total) by mouth daily.         DATA REVIEWED  Radiologic Exams:   Quality mobile x-ray 08/17/2013  Chest x-ray: No evidence of acute pulmonary disease  Cardiovascular Exams:   Laboratory Studies: Lab Results  Component Value Date   WBC 4.1 07/31/2013   HGB 10.2* 07/31/2013   HCT 30.1* 07/31/2013   MCV 94.1 07/31/2013   PLT 176 07/31/2013     Component Value Date   NA 134* 07/31/2013   K 4.4 07/31/2013   CO2 24 07/31/2013   GLUCOSE 91 07/31/2013   BUN 18.8 07/31/2013   CREATININE 0.8 07/31/2013   CALCIUM 9.0 07/31/2013   ALBUMIN 3.2* 07/31/2013   AST 25 07/31/2013   ALT 21 07/31/2013   ALKPHOS 79 07/31/2013   BILITOT 0.35 07/31/2013   Lab Results    Component Value Date   NA 120* 08/17/2013   K 2.9* 08/17/2013   GLU 87 08/17/2013   BUN 10 08/17/2013   CREATININE 0.5 08/17/2013        Past Medical History  Diagnosis Date  . Ovarian cancer 07/2007  . Hypertension   . Hypothyroidism   . 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 C. difficile colitis 02/23/2011  . Pneumonia 02/23/2011  . Hypothyroid 06/02/2011  . DJD (degenerative joint disease) 06/02/2011  . Benign essential HTN 06/02/2011  . History of positive PPD 06/02/2011  . Calcified granuloma of lung 06/02/2011  . Pleural effusion, right 09/15/2012    Thoracentesis 09/05/12: plasma cells on cytology CD138 positive  . Anemia   . Calf swelling 10/24/2012    LLE  Week 3 cycle 1 revlimid  . DVT (deep venous thrombosis) 2014    re; Revlimid. Chronic anticoagulation   Past Surgical History  Procedure Laterality Date  . Laparoscopic hysterectomy  age 71  . Laparoscopic salpingoopherectomy  07/2007    left  . Hernia repair  08/2007  . Appendectomy    . Replacement total knee  2008    LEFT   Family Status  Relation Status Death Age  . Mother Deceased   . Father Deceased    History   Social History Narrative   Patient is Widowed. Lives in single level home Independent Living section at [facility] since 2005. Originally from  [facility], lived in [place] for many years.   Quit Smoking many years ago (1980s). Likes wine with dinner.   Drives, Walks in park 3/week,Has cane/walker to ambulate for balance   Patient has Advanced planning documents: Living Will, HCPOA              REVIEW OF SYSTEMS  DATA OBTAINED: from patient, nurse, medical record GENERAL: Feels weak, but better that yesterday, food tasted good today    SKIN: No itch, rash or open wounds EYES: No eye pain, dryness or itching  No change in vision EARS: No earache, change in hearing NOSE: No congestion, drainage or bleeding MOUTH/THROAT: No mouth or tooth pain  No sore  throat   No difficulty chewing or swallowing RESPIRATORY: Cough, No wheezing, SOB CARDIAC: No chest pain, palpitations  No edema. GI: No abdominal pain  No nausea, vomiting, No heartburn or reflux  Had diarrhea earlier this week (antibiotic?) GU: No dysuria, frequency or urgency  No change in urine volume or character  MUSCULOSKELETAL: No joint pain, swelling or stiffness  No back pain  Feels weak in general   Gait is unsteady  No recent falls.  NEUROLOGIC: No dizziness, fainting, headache, numbness  No change in mental status.  PSYCHIATRIC: No feelings of anxiety, depression. Wants to feel better"this is not me"        PHYSICAL EXAM Filed Vitals:   08/17/13 1422  BP: 173/75  Pulse: 66  Temp: 97.1 F (36.2 C)  Resp: 16  Weight: 126 lb 9.6 oz (57.425 kg)  SpO2: 93%   Body mass index is 22.43 kg/(m^2).  GENERAL APPEARANCE: No acute distress, appropriately groomed, normal body habitus. Alert, pleasant, conversant. SKIN: No diaphoresis, rash, unusual lesions, wounds HEAD: Normocephalic, atraumatic EYES: Conjunctiva/lids clear. Pupils round, reactive.    EARS: External exam WNL . Hearing grossly normal. NOSE: No deformity or discharge. MOUTH/THROAT: Lips w/o lesions. Oral mucosa, tongue moist, w/o lesion. Oropharynx w/o redness or lesions.  NECK: Supple, full ROM. No thyroid tenderness, enlargement or nodule LYMPHATICS: No head, neck or supraclavicular adenopathy RESPIRATORY: Breathing is even, unlabored. Lungs with few scattered rhonchi, harsh cough present CARDIOVASCULAR: Heart IRRR. No murmur or extra heart sounds  ARTERIAL: No carotid bruit.   VENOUS: No varicosities. No venous stasis skin changes  EDEMA: No peripheral edema.  GASTROINTESTINAL: Abdomen is soft, non-tender, not distended w/ normal bowel sounds. Medium-sized periumbilical hernia, soft easily reducible.   GENITOURINARY: Bladder non tender, not distended. MUSCULOSKELETAL: Moves all extremities with full ROM, strength  and tone. Back is without kyphosis, scoliosis or spinal process tenderness.  NEUROLOGIC: Oriented to time, place, person. Cranial nerves 2-12 grossly intact, speech clear, no tremor. PSYCHIATRIC: Mood and affect appropriate to situation   ASSESSMENT/PLAN  Multiple myeloma, without mention of having achieved remission Most recent evaluation by Dr. Benay Spice 08/04/2013, patient was stable. Medication (Nevlimid) is on hold until patient recovers from this acute respiratory illness  Hyponatremia Lab of significant hyponatremia today, start IV replacement, recheck labs tomorrow  Hypokalemia Lab of significant hyponatremia today, start IV replacement, recheck labs tomorrow   Long term (current) use of anticoagulants Most recent INR 3.0 on 07/31/2013. Since that time patient has completed a course of azithromycin. Repeat INR tomorrow  Physical deconditioning Generalized weakness related to acute respiratory illness and now electrolyte abnormality. She is unsteady on her feet requires assistance with ADLs. Recommend she stay in rehabilitation section until closer to her baseline active status. Recommend physical and occupational therapy to maximize functional status  Acute bronchitis Patient with harsh cough, generalized fatigue, chest x-ray without evidence of pneumonia. This likely represents viral bronchitis. Treatment will be a bit supportive measures, continue albuterol treatments, add Robitussin to soothe cough and aid expectoration. Discuss with patient that this cough may persist for weeks.    Family/ staff Communication:  Discussed findings and plan with patient and son, Dr. Serita Butcher.   Goals of care:   Return to prior level of functioning, return to independent living home   Labs/tests ordered: 08/18/2013 CBC, BMP, INR   Follow up: Return for As needed.  Mardene Celeste, NP-C [place] [phone]  08/17/2013

## 2013-08-17 NOTE — Assessment & Plan Note (Signed)
Most recent INR 3.0 on 07/31/2013. Since that time patient has completed a course of azithromycin. Repeat INR tomorrow

## 2013-08-17 NOTE — Assessment & Plan Note (Signed)
Patient with harsh cough, generalized fatigue, chest x-ray without evidence of pneumonia. This likely represents viral bronchitis. Treatment will be a bit supportive measures, continue albuterol treatments, add Robitussin to soothe cough and aid expectoration. Discuss with patient that this cough may persist for weeks.

## 2013-08-17 NOTE — Assessment & Plan Note (Signed)
Lab of significant hyponatremia today, start IV replacement, recheck labs tomorrow 

## 2013-08-17 NOTE — Assessment & Plan Note (Signed)
Generalized weakness related to acute respiratory illness and now electrolyte abnormality. She is unsteady on her feet requires assistance with ADLs. Recommend she stay in rehabilitation section until closer to her baseline active status. Recommend physical and occupational therapy to maximize functional status

## 2013-08-17 NOTE — Assessment & Plan Note (Signed)
Most recent evaluation by Dr. Benay Spice 08/04/2013, patient was stable. Medication (Nevlimid) is on hold until patient recovers from this acute respiratory illness

## 2013-08-17 NOTE — Telephone Encounter (Signed)
called pt regarding appt and Mailed appts

## 2013-08-17 NOTE — Assessment & Plan Note (Signed)
Lab of significant hyponatremia today, start IV replacement, recheck labs tomorrow

## 2013-08-18 ENCOUNTER — Telehealth: Payer: Self-pay | Admitting: Pharmacist

## 2013-08-18 LAB — POCT INR: INR: 1.4 — AB (ref 0.9–1.1)

## 2013-08-18 LAB — BASIC METABOLIC PANEL
BUN: 8 mg/dL (ref 4–21)
CREATININE: 0.6 mg/dL (ref 0.5–1.1)
Glucose: 89 mg/dL
Potassium: 3.3 mmol/L — AB (ref 3.4–5.3)
SODIUM: 124 mmol/L — AB (ref 137–147)

## 2013-08-18 LAB — CBC AND DIFFERENTIAL
HEMATOCRIT: 29 % — AB (ref 36–46)
Hemoglobin: 10.1 g/dL — AB (ref 12.0–16.0)
Platelets: 184 10*3/uL (ref 150–399)
WBC: 4.3 10^3/mL

## 2013-08-18 NOTE — Telephone Encounter (Signed)
Received fax from [facility] regarding antibiotic use with azithromycin for Heidi Pratt who is also on coumadin. It is unclear when Heidi Pratt started or stopped azithromycin as Dr. Benay Spice was not the MD who prescribed azithromycin. I tried calling to see if patient was still on the antibiotic and if so to have her come in sooner for an INR check. I was unable to reach patient and left multiple voicemails on 5/14 and 5/15.  Patient is scheduled for INR lab draw on 08/24/13 next week.    Montel Clock, PharmD

## 2013-08-21 ENCOUNTER — Encounter: Payer: Self-pay | Admitting: Geriatric Medicine

## 2013-08-21 ENCOUNTER — Non-Acute Institutional Stay (SKILLED_NURSING_FACILITY): Payer: Medicare Other | Admitting: Geriatric Medicine

## 2013-08-21 DIAGNOSIS — E876 Hypokalemia: Secondary | ICD-10-CM

## 2013-08-21 DIAGNOSIS — J209 Acute bronchitis, unspecified: Secondary | ICD-10-CM

## 2013-08-21 DIAGNOSIS — E871 Hypo-osmolality and hyponatremia: Secondary | ICD-10-CM

## 2013-08-21 DIAGNOSIS — Z7901 Long term (current) use of anticoagulants: Secondary | ICD-10-CM

## 2013-08-21 DIAGNOSIS — R5381 Other malaise: Secondary | ICD-10-CM

## 2013-08-21 NOTE — Assessment & Plan Note (Signed)
Improved after IVF though not back to normal range, repeat BMP tomorrow 

## 2013-08-21 NOTE — Assessment & Plan Note (Signed)
INR subtherapeutic on the 15th,  additional 2.5 mg warfarin given. Today INR improved but not back to therapeutic range, give additional 2.5 mg today repeat INR on Thursday

## 2013-08-21 NOTE — Progress Notes (Signed)
Patient ID: Heidi Pratt, female   DOB: April 29, 1918, 80 y.o.   MRN: [id number]   [facility] [phone])  Code Status: Full Code, Living Will      Contact Information   Name Relation Home Work Mobile   [place] Son [phone]  [phone]   Bonf[place],von Daughter [phone]     Keelia, Graybill [phone]          HPI: This is a 80 y.o. female resident of [place], [place] admitted to the rehabilitation section 08/16/2013 evening due to progressive weakness following upper respiratory infection.  Last visit: Multiple myeloma, without mention of having achieved remission Most recent evaluation by Dr. Benay Spice 08/04/2013, patient was stable. Medication (Nevlimid) is on hold until patient recovers from this acute respiratory illness Hyponatremia Lab of significant hyponatremia today, start IV replacement, recheck labs tomorrow Hypokalemia Lab of significant hyponatremia today, start IV replacement, recheck labs tomorrow Long term (current) use of anticoagulants Most recent INR 3.0 on 07/31/2013. Since that time patient has completed a course of azithromycin. Repeat INRtomorrow Physical deconditioning Generalized weakness related to acute respiratory illness and now electrolyte abnormality. She is unsteady on her feet requires assistance with ADLs. Recommend she stay in rehabilitation section until closer to her baseline active status. Recommend physical and occupational therapy to maximize functional status Acute bronchitis Patient with harsh cough, generalized fatigue, chest x-ray without evidence of pneumonia. This likely represents viral bronchitis. Treatment will be a bit supportive measures, continue albuterol treatments, add Robitussin to soothe cough and aid expectoration. Discuss with patient that this cough may persist for weeks.  Since last visit patient's general condition has improved  significantly. She is eating and drinking well, cough is improving. Electrolytes with significant improvement after IV replacement. Most recent INR was subtherapeutic, she received an extra dose of warfarin on Friday, INR is still below range today.  PT and OT evaluations are in process, patient is ambulating independently within her room, no other significant activity yet.    Allergies  Allergen Reactions  . Amoxicillin Diarrhea  . Crab [Shellfish Allergy] Nausea And Vomiting     MEDICATIONS -  Reviewed Dr. Nyoka Cowden evaluated patient last week, stopped Fosamax as patient has taken this medication >5 years    DATA REVIEWED  Radiologic Exams:   Quality mobile x-ray 08/17/2013  Chest x-ray: No evidence of acute pulmonary disease  Cardiovascular Exams:   Laboratory Studies: Lab Results  Component Value Date   WBC 4.1 07/31/2013   HGB 10.2* 07/31/2013   HCT 30.1* 07/31/2013   MCV 94.1 07/31/2013   PLT 176 07/31/2013     Component Value Date   NA 134* 07/31/2013   K 4.4 07/31/2013   CO2 24 07/31/2013   GLUCOSE 91 07/31/2013   BUN 18.8 07/31/2013   CREATININE 0.8 07/31/2013   CALCIUM 9.0 07/31/2013   ALBUMIN 3.2* 07/31/2013   AST 25 07/31/2013   ALT 21 07/31/2013   ALKPHOS 79 07/31/2013   BILITOT 0.35 07/31/2013   Lab Results  Component Value Date   NA 120* 08/17/2013   K 2.9* 08/17/2013   GLU 87 08/17/2013   BUN 10 08/17/2013   CREATININE 0.5 08/17/2013    Lab Results  Component Value Date   WBC 4.3 08/18/2013   HGB 10.1* 08/18/2013   HCT 29* 08/18/2013   MCV 94.1 07/31/2013   PLT 184 08/18/2013   Lab Results  Component Value Date   NA 124* 08/18/2013   K 3.3* 08/18/2013  GLU 89 08/18/2013   BUN 8 08/18/2013   CREATININE 0.6 08/18/2013    Lab Results  Component Value Date   INR 1.4* 08/18/2013   INR 3.0 07/31/2013   INR 3.00 07/31/2013   PROTIME 36.0* 07/31/2013   PROTIME 28.8* 07/03/2013   PROTIME 27.6* 06/12/2013   REVIEW OF SYSTEMS  DATA OBTAINED: from patient, nurse, medical  record GENERAL: Feels much better than last week, appetite is good, limited activity   SKIN: No itch, rash or open wounds EYES: No eye pain, dryness or itching  No change in vision EARS: No earache, change in hearing NOSE: No congestion, drainage or bleeding MOUTH/THROAT: No mouth or tooth pain  No sore throat   No difficulty chewing or swallowing RESPIRATORY: Less coughing,  No wheezing, SOB CARDIAC: No chest pain, palpitations  No edema. GI: No abdominal pain  No nausea, vomiting, No heartburn or reflux  Had diarrhea earlier today after feeling constipated GU: No dysuria, frequency or urgency  No change in urine volume or character  MUSCULOSKELETAL: No joint pain, swelling or stiffness  No back pain  Feels weak in general   Gait is unsteady  No recent falls.  NEUROLOGIC: No dizziness, fainting, headache, numbness  No change in mental status.  PSYCHIATRIC: No feelings of anxiety, depression. Wants to feel better"this is not me"        PHYSICAL EXAM Filed Vitals:   08/21/13 1043  BP: 159/77  Pulse: 70  Temp: 97.8 F (36.6 C)  Resp: 15  SpO2: 94%   There is no weight on file to calculate BMI.  GENERAL APPEARANCE: No acute distress, appropriately groomed, normal body habitus. Alert, pleasant, conversant. SKIN: No diaphoresis, rash, unusual lesions, wounds HEAD: Normocephalic, atraumatic EYES: Conjunctiva/lids clear. Pupils round, reactive.    EARS: External exam WNL . Hearing grossly normal. NOSE: No deformity or discharge. MOUTH/THROAT: Lips w/o lesions. Oral mucosa, tongue moist, w/o lesion. Oropharynx w/o redness or lesions.  NECK: Supple, full ROM. No thyroid tenderness, enlargement or nodule LYMPHATICS: No head, neck or supraclavicular adenopathy RESPIRATORY: Breathing is even, unlabored. Lung fields clear, harsh cough present  CARDIOVASCULAR: Heart IRRR. No murmur or extra heart sounds   EDEMA: No peripheral edema.  GASTROINTESTINAL: Abdomen is soft, non-tender, not  distended w/ normal bowel sounds. Medium-sized periumbilical hernia, soft easily reducible.   GENITOURINARY: Bladder non tender, not distended. NEUROLOGIC: Oriented to time, place, person.Speech clear, no tremor.  PSYCHIATRIC: Mood and affect appropriate to situation   ASSESSMENT/PLAN  Acute bronchitis Cough is improving, not resolved. Continues to feel weak. PT/OT eval/ interventions pending  Physical deconditioning Some improvement in generalized weakness; patient is ambulating independently. She hasn't resumed complete independent function with ADLs, PT and OT eval and interventions are pending. Recommend rehabilitation stay until she's closer to baseline  Long term (current) use of anticoagulants INR subtherapeutic on the 15th,  additional 2.5 mg warfarin given. Today INR improved but not back to therapeutic range, give additional 2.5 mg today repeat INR on Thursday  Hyponatremia Improved after IVF though not back to normal range, repeat BMP tomorrow  Hypokalemia Improved after IVF though not back to normal range, repeat BMP tomorrow     Family/ staff Communication:  Discussed findings and plan with patient and son, Dr. Serita Butcher.   Goals of care:   Return to prior level of functioning, return to independent living home   Labs/tests ordered:  5/19:  BMP, 5/21 INR  Follow up: Return for As needed.  Hargis Vandyne T.Aviana Shevlin, NP-C  [place] 336 [phone]  08/21/2013

## 2013-08-21 NOTE — Assessment & Plan Note (Signed)
Improved after IVF though not back to normal range, repeat BMP tomorrow

## 2013-08-21 NOTE — Assessment & Plan Note (Signed)
Cough is improving, not resolved. Continues to feel weak. PT/OT eval/ interventions pending

## 2013-08-21 NOTE — Assessment & Plan Note (Signed)
Some improvement in generalized weakness; patient is ambulating independently. She hasn't resumed complete independent function with ADLs, PT and OT eval and interventions are pending. Recommend rehabilitation stay until she's closer to baseline

## 2013-08-22 LAB — BASIC METABOLIC PANEL
BUN: 15 mg/dL (ref 4–21)
Creatinine: 0.6 mg/dL (ref 0.5–1.1)
GLUCOSE: 69 mg/dL
Potassium: 3.3 mmol/L — AB (ref 3.4–5.3)
Sodium: 134 mmol/L — AB (ref 137–147)

## 2013-08-24 ENCOUNTER — Encounter: Payer: Self-pay | Admitting: Pharmacist

## 2013-08-24 ENCOUNTER — Other Ambulatory Visit: Payer: Medicare Other

## 2013-08-24 LAB — POCT INR: INR: 2.4 — AB (ref 0.9–1.1)

## 2013-08-24 NOTE — Progress Notes (Signed)
Ms Heidi Pratt did not come to [facility] for lab today.  Upon chart review, Ms Heidi Pratt was transferred to rehab unit at [facility] for generalized weakness and acute bronchitis.  The medical team at rehab unit is monitoring and managing coumadin.  Will f/u next week and schedule lab appt at [facility] as needed.

## 2013-08-29 ENCOUNTER — Encounter: Payer: Self-pay | Admitting: Geriatric Medicine

## 2013-08-29 ENCOUNTER — Non-Acute Institutional Stay (SKILLED_NURSING_FACILITY): Payer: Medicare Other | Admitting: Geriatric Medicine

## 2013-08-29 DIAGNOSIS — E871 Hypo-osmolality and hyponatremia: Secondary | ICD-10-CM

## 2013-08-29 DIAGNOSIS — E876 Hypokalemia: Secondary | ICD-10-CM

## 2013-08-29 DIAGNOSIS — J209 Acute bronchitis, unspecified: Secondary | ICD-10-CM

## 2013-08-29 DIAGNOSIS — R5381 Other malaise: Secondary | ICD-10-CM

## 2013-08-29 DIAGNOSIS — Z7901 Long term (current) use of anticoagulants: Secondary | ICD-10-CM

## 2013-08-29 DIAGNOSIS — C9000 Multiple myeloma not having achieved remission: Secondary | ICD-10-CM

## 2013-08-29 NOTE — Progress Notes (Signed)
Patient ID: Heidi Pratt, female   DOB: [birth year], 80 y.o.   MRN: [id number]   [facility] [phone])  Code Status: Full Code, Living Will  Contact Information   Name Relation Home Work [place] Son [phone]  [phone]   Le[place],von Daughter [phone]     [place], Bezdek [phone]        Chief Complaint  Patient presents with  . Bronchitis  . Electrolyte disturbance  . Deconditioning  . Anticoagulation     HPI: This is a 80 y.o. female resident of [place], [place] admitted to the rehabilitation section 08/16/2013 evening due to progressive weakness following upper respiratory infection.  Last visit: Acute bronchitis Cough is improving, not resolved. Continues to feel weak. PT/OT eval/ interventions pending Physical deconditioning Some improvement in generalized weakness; patient is ambulating independently. She hasn't resumed complete independent function with ADLs, PT and OT eval and interventions are pending. Recommend rehabilitation stay until she's closer to baseline Long term (current) use of anticoagulants INR subtherapeutic on the 15th,  additional 2.5 mg warfarin given. Today INR improved but not back to therapeutic range, give additional 2.5 mg today repeat INR on Thursday Hyponatremia Improved after IVF though not back to normal range, repeat BMP tomorrow Hypokalemia Improved after IVF though not back to normal range, repeat BMP tomorrow  Since last visit patient has been making good progress. She reports very minimal cough, staff reports patient is nearly independent with ADLs, is eating well. She does continue to tire very easily. Most recent lab studies showed sodium normal range, potassium remains a little. Most recent INR in therapeutic range. Patient started working with physical therapy today, he is encouraged with plan.    Allergies  Allergen  Reactions  . Amoxicillin Diarrhea  . Crab [Shellfish Allergy] Nausea And Vomiting     MEDICATIONS -  Reviewed   DATA REVIEWED  Radiologic Exams:   [facility] mobile x-ray 08/17/2013  Chest x-ray: No evidence of acute pulmonary disease  Cardiovascular Exams:   Laboratory Studies: Lab Results  Component Value Date   WBC 4.1 07/31/2013   HGB 10.2* 07/31/2013   HCT 30.1* 07/31/2013   MCV 94.1 07/31/2013   PLT 176 07/31/2013     Component Value Date   NA 134* 07/31/2013   K 4.4 07/31/2013   CO2 24 07/31/2013   GLUCOSE 91 07/31/2013   BUN 18.8 07/31/2013   CREATININE 0.8 07/31/2013   CALCIUM 9.0 07/31/2013   ALBUMIN 3.2* 07/31/2013   AST 25 07/31/2013   ALT 21 07/31/2013   ALKPHOS 79 07/31/2013   BILITOT 0.35 07/31/2013     Lab Results  Component Value Date   WBC 4.3 08/18/2013   HGB 10.1* 08/18/2013   HCT 29* 08/18/2013   MCV 94.1 07/31/2013   PLT 184 08/18/2013   Lab Results  Component Value Date   NA 124* 08/18/2013   K 3.3* 08/18/2013   GLU 89 08/18/2013   BUN 8 08/18/2013   CREATININE 0.6 08/18/2013   Lab Results  Component Value Date   NA 134* 08/22/2013   K 3.3* 08/22/2013   GLU 69 08/22/2013   BUN 15 08/22/2013   CREATININE 0.6 08/22/2013     Lab Results  Component Value Date   INR 2.4* 08/24/2013   INR 1.4* 08/18/2013   INR 3.0 07/31/2013   PROTIME 36.0* 07/31/2013   PROTIME 28.8* 07/03/2013   PROTIME 27.6* 06/12/2013   REVIEW OF SYSTEMS  DATA  OBTAINED: from patient, nurse, medical record GENERAL: Feels much better than last week, appetite is good, activity  improved, continues to tire easily SKIN: No itch, rash or open wounds EYES: No eye pain, dryness or itching  No change in vision EARS: No earache, change in hearing NOSE: No congestion, drainage or bleeding MOUTH/THROAT: No mouth or tooth pain  No sore throat   No difficulty chewing or swallowing RESPIRATORY: Minimal coughing,  No wheezing, SOB CARDIAC: No chest pain, palpitations  No edema. GI: No abdominal pain   No nausea, vomiting, diarrhea or No heartburn or reflux   GU: No dysuria, frequency or urgency  No change in urine volume or character  MUSCULOSKELETAL: No joint pain, swelling or stiffness  No back pain  Feeling stronger but not back to baseline    Gait is unsteady  No recent falls.  NEUROLOGIC: No dizziness, fainting, headache, numbness  No change in mental status.  PSYCHIATRIC: No feelings of anxiety, depression.     PHYSICAL EXAM Filed Vitals:   08/29/13 1420  BP: 152/73  Pulse: 70  Temp: 98.4 F (36.9 C)  Resp: 19  Weight: 119 lb 9.6 oz (54.25 kg)  SpO2: 97%   Body mass index is 21.19 kg/(m^2).  GENERAL APPEARANCE: No acute distress, appropriately groomed, normal body habitus. Alert, pleasant, conversant. SKIN: No diaphoresis, rash, unusual lesions, wounds HEAD: Normocephalic, atraumatic EYES: Conjunctiva/lids clear. Pupils round, reactive.    EARS: External exam WNL . Hearing grossly normal. NOSE: No deformity or discharge. MOUTH/THROAT: Lips w/o lesions. Oral mucosa, tongue moist, w/o lesion. Oropharynx w/o redness or lesions.  RESPIRATORY: Breathing is even, unlabored. Lung fields clear, no cough present  CARDIOVASCULAR: Heart RRR. No murmur or extra heart sounds   EDEMA: No peripheral edema.  NEUROLOGIC: Oriented to time, place, person.Speech clear, no tremor.  PSYCHIATRIC: Mood and affect appropriate to situation   ASSESSMENT/PLAN  Physical deconditioning Continues to make progress towards returning to prior level of functioning. She is nearly independent with ADLs, is ambulating well with her walker. Continues to tire easily feel she's not ready to be at home by herself. Continue physical and occupational therapies, anticipate discharge to independent living home later this week  Long term (current) use of anticoagulants INR in therapeutic range at last measure, next INR 08/31/2013  Hypokalemia Mild hypokalemia persists, asymptomatic. Encourage PO intake, recheck  BMP 5/28  Hyponatremia IN normal range. Patient with chronic mild low NA, monitor at intervals  Multiple myeloma, without mention of having achieved remission Anticipate patient will restart Revlimid next week when she is at home.  Acute bronchitis Cough is nearly resolved, energy level not yet at baseline. Continue PT/OT    Family/ staff Communication:  Discussed findings and plan with patient and son, Dr. Serita Butcher.   Goals of care:   Return to prior level of functioning, return to independent living home   Labs/tests ordered:   08/31/13 INR, BMP  Follow up: No Follow-up on file.  Mardene Celeste, NP-C [place] [phone]  08/29/2013

## 2013-08-29 NOTE — Assessment & Plan Note (Addendum)
Mild hypokalemia persists, asymptomatic. Encourage PO intake, recheck BMP 5/28

## 2013-08-29 NOTE — Assessment & Plan Note (Signed)
INR in therapeutic range at last measure, next INR 08/31/2013

## 2013-08-29 NOTE — Assessment & Plan Note (Signed)
Anticipate patient will restart Revlimid next week when she is at home.

## 2013-08-29 NOTE — Assessment & Plan Note (Signed)
Cough is nearly resolved, energy level not yet at baseline. Continue PT/OT

## 2013-08-29 NOTE — Assessment & Plan Note (Signed)
IN normal range. Patient with chronic mild low NA, monitor at intervals

## 2013-08-29 NOTE — Assessment & Plan Note (Signed)
Continues to make progress towards returning to prior level of functioning. She is nearly independent with ADLs, is ambulating well with her walker. Continues to tire easily feel she's not ready to be at home by herself. Continue physical and occupational therapies, anticipate discharge to independent living home later this week

## 2013-08-30 ENCOUNTER — Telehealth: Payer: Self-pay | Admitting: *Deleted

## 2013-08-30 NOTE — Telephone Encounter (Signed)
Received faxed request for Revlimid refill from  [facility].  Pt. Was due to restart revlimid on 08-14-13 but did not due to not feeling well.  She subsequently has gone to a rehab center and note on 08/29/13 states that they anticipate that she will re-start revlimid when she goes home next week.   Notified [facility] /Danny Perdue and let him know that revlimid was held 08-14-13 until present and may not be restarted until next week.  He appreciated the notification.

## 2013-08-31 ENCOUNTER — Other Ambulatory Visit: Payer: Self-pay | Admitting: *Deleted

## 2013-08-31 DIAGNOSIS — C9000 Multiple myeloma not having achieved remission: Secondary | ICD-10-CM

## 2013-08-31 LAB — POCT INR: INR: 2.3 — AB (ref 0.9–1.1)

## 2013-08-31 LAB — BASIC METABOLIC PANEL
BUN: 19 mg/dL (ref 4–21)
CREATININE: 0.7 mg/dL (ref 0.5–1.1)
Potassium: 4 mmol/L (ref 3.4–5.3)
Sodium: 134 mmol/L — AB (ref 137–147)

## 2013-08-31 NOTE — Telephone Encounter (Signed)
Refill request for Revlimid to RN desk to hold.  Attempted to reach patient to inform her she needs to complete her [employer] survey-no answer or voice mail. Spoke with son, who reports she is in rehab at [facility] due to recent bout of bronchitis. Her Revlimid has been on hold for couple weeks. Potential return home on Monday, 09/04/13. Will attempt to call patient next week.

## 2013-09-04 ENCOUNTER — Non-Acute Institutional Stay (SKILLED_NURSING_FACILITY): Payer: Medicare Other | Admitting: Geriatric Medicine

## 2013-09-04 DIAGNOSIS — R5381 Other malaise: Secondary | ICD-10-CM

## 2013-09-04 DIAGNOSIS — J209 Acute bronchitis, unspecified: Secondary | ICD-10-CM

## 2013-09-04 DIAGNOSIS — C9000 Multiple myeloma not having achieved remission: Secondary | ICD-10-CM

## 2013-09-04 DIAGNOSIS — E871 Hypo-osmolality and hyponatremia: Secondary | ICD-10-CM

## 2013-09-04 DIAGNOSIS — E876 Hypokalemia: Secondary | ICD-10-CM

## 2013-09-04 DIAGNOSIS — Z7901 Long term (current) use of anticoagulants: Secondary | ICD-10-CM

## 2013-09-07 ENCOUNTER — Encounter: Payer: Self-pay | Admitting: Geriatric Medicine

## 2013-09-07 NOTE — Assessment & Plan Note (Signed)
Significant physical deconditioning due to acute bronchitis and hyponatremia. Patient is continuing to progress towards her baseline level of activity, will benefit from continued physical therapy at home.

## 2013-09-07 NOTE — Assessment & Plan Note (Signed)
Patient followed by Dr. Benay Spice, next visit 09/28/2013. Revlimid has been on hold during this acute illness, patient will resume this medication once discharged home.

## 2013-09-07 NOTE — Assessment & Plan Note (Signed)
Cough is resolved, and energy level not quite back to baseline but patient is safe to return to her independent living home. Physical therapy will continue working with her home

## 2013-09-07 NOTE — Progress Notes (Signed)
Patient ID: Heidi Pratt, female   DOB: May 24, 1918, 80 y.o.   MRN: [id number]   [facility] [phone])  Code Status: Full Code, Living Will  Contact Information   Name Relation Home Work [place] Son [phone]  [phone]   Quang,von Daughter [phone]     [place], Ting [phone]        Chief Complaint  Patient presents with  . Bronchitis    Rehab discharge  . Deconditioning     HPI: This is a 80 y.o. female resident of [place], [place] admitted to the rehabilitation section 08/16/2013 evening due to progressive weakness following upper respiratory infection.   Last visit: Physical deconditioning Continues to make progress towards returning to prior level of functioning. She is nearly independent with ADLs, is ambulating well with her walker. Continues to tire easily feel she's not ready to be at home by herself. Continue physical and occupational therapies, anticipate discharge to independent living home later this week Long term (current) use of anticoagulants INR in therapeutic range at last measure, next INR 08/31/2013 Hypokalemia Mild hypokalemia persists, asymptomatic. Encourage PO intake, recheck BMP 5/28 Hyponatremia In normal range. Patient with chronic mild low NA, monitor at intervals  Since last visit, patient has continued to make progress towards returning to her prior level of functioning. She is independent with ADLs to standby he can well with her walker she does continue to tire easily but is increasing her endurance daily. She has completed a home safety assessment, no medications required. Most recent laboratory studies were all satisfactory.   Allergies  Allergen Reactions  . Amoxicillin Diarrhea  . Crab [Shellfish Allergy] Nausea And Vomiting     MEDICATIONS -  Reviewed   DATA REVIEWED  Radiologic Exams:   Quality mobile  x-ray 08/17/2013  Chest x-ray: No evidence of acute pulmonary disease  Cardiovascular Exams:   Laboratory Studies:  Lab Results  Component Value Date   WBC 4.3 08/18/2013   HGB 10.1* 08/18/2013   HCT 29* 08/18/2013   MCV 94.1 07/31/2013   PLT 184 08/18/2013   Lab Results  Component Value Date   NA 134* 08/22/2013   K 3.3* 08/22/2013   GLU 69 08/22/2013   BUN 15 08/22/2013   CREATININE 0.6 08/22/2013   Lab Results  Component Value Date   NA 134* 08/31/2013   K 4.0 08/31/2013   GLU 69 08/22/2013   BUN 19 08/31/2013   CREATININE 0.7 08/31/2013     Lab Results  Component Value Date   INR 2.3* 08/31/2013   INR 2.4* 08/24/2013   INR 1.4* 08/18/2013   PROTIME 36.0* 07/31/2013   PROTIME 28.8* 07/03/2013   PROTIME 27.6* 06/12/2013   REVIEW OF SYSTEMS  DATA OBTAINED: from patient, nurse, medical record GENERAL: Feels much better, appetite is good, activity  improved, continues to tire easily SKIN: No itch, rash or open wounds EYES: No eye pain, dryness or itching  No change in vision EARS: No earache, change in hearing NOSE: No congestion, drainage or bleeding MOUTH/THROAT: No mouth or tooth pain  No sore throat   No difficulty chewing or swallowing RESPIRATORY: No coughing,  No wheezing, SOB CARDIAC: No chest pain, palpitations  No edema. GI: No abdominal pain  No nausea, vomiting, diarrhea or No heartburn or reflux   GU: No dysuria, frequency or urgency  No change in urine volume or character  MUSCULOSKELETAL: No joint pain, swelling or stiffness  No back  pain  Feeling stronger, but quite back to baseline    Gait is steady  No recent falls.  NEUROLOGIC: No dizziness, fainting, headache, numbness  No change in mental status.  PSYCHIATRIC: No feelings of anxiety, depression.     PHYSICAL EXAM Vitals from 09/04/2013 Filed Vitals:   09/07/13 1631  BP: 156/76  Pulse: 65  Temp: 97.1 F (36.2 C)  Resp: 21  Weight: 119 lb 9.6 oz (54.25 kg)  SpO2: 98%   Body mass index is 21.19  kg/(m^2).  GENERAL APPEARANCE: No acute distress, appropriately groomed, normal body habitus. Alert, pleasant, conversant. SKIN: No diaphoresis, rash, unusual lesions, wounds HEAD: Normocephalic, atraumatic EYES: Conjunctiva/lids clear. Pupils round, reactive.    EARS: External exam WNL . Hearing grossly normal. NOSE: No deformity or discharge. MOUTH/THROAT: Lips w/o lesions. Oral mucosa, tongue moist, w/o lesion. Oropharynx w/o redness or lesions.  RESPIRATORY: Breathing is even, unlabored. Lung fields clear, no cough present  CARDIOVASCULAR: Heart RRR. No murmur or extra heart sounds   EDEMA: No peripheral edema.  NEUROLOGIC: Oriented to time, place, person.Speech clear, no tremor.  PSYCHIATRIC: Mood and affect appropriate to situation   ASSESSMENT/PLAN : Discharge to independent living home today Acute bronchitis Cough is resolved, and energy level not quite back to baseline but patient is safe to return to her independent living home. Physical therapy will continue working with her home  Hypokalemia Resolved  Hyponatremia Chronic low sodium, most recent level satisfactory.  Long term (current) use of anticoagulants INR remains in therapeutic range, she will follow with oncology office for future monitoring (09/25/2013).  Multiple myeloma, without mention of having achieved remission Patient followed by Dr. Benay Spice, next visit 09/28/2013. Revlimid has been on hold during this acute illness, patient will resume this medication once discharged home.  Physical deconditioning Significant physical deconditioning due to acute bronchitis and hyponatremia. Patient is continuing to progress towards her baseline level of activity, will benefit from continued physical therapy at home.    Mardene Celeste, NP-C [place] [phone]  09/04/2013

## 2013-09-07 NOTE — Assessment & Plan Note (Signed)
Chronic low sodium, most recent level satisfactory.

## 2013-09-07 NOTE — Assessment & Plan Note (Signed)
Resolved

## 2013-09-07 NOTE — Assessment & Plan Note (Addendum)
INR remains in therapeutic range, she will follow with oncology office for future monitoring (09/25/2013).

## 2013-09-25 ENCOUNTER — Ambulatory Visit: Payer: Medicare Other | Admitting: Pharmacist

## 2013-09-25 ENCOUNTER — Other Ambulatory Visit (HOSPITAL_BASED_OUTPATIENT_CLINIC_OR_DEPARTMENT_OTHER): Payer: Medicare Other

## 2013-09-25 ENCOUNTER — Other Ambulatory Visit: Payer: Self-pay | Admitting: *Deleted

## 2013-09-25 DIAGNOSIS — C9000 Multiple myeloma not having achieved remission: Secondary | ICD-10-CM

## 2013-09-25 DIAGNOSIS — I82409 Acute embolism and thrombosis of unspecified deep veins of unspecified lower extremity: Secondary | ICD-10-CM

## 2013-09-25 LAB — COMPREHENSIVE METABOLIC PANEL (CC13)
ALBUMIN: 3.3 g/dL — AB (ref 3.5–5.0)
ALK PHOS: 91 U/L (ref 40–150)
ALT: 25 U/L (ref 0–55)
AST: 20 U/L (ref 5–34)
Anion Gap: 8 mEq/L (ref 3–11)
BUN: 15.4 mg/dL (ref 7.0–26.0)
CO2: 26 mEq/L (ref 22–29)
CREATININE: 0.7 mg/dL (ref 0.6–1.1)
Calcium: 8.8 mg/dL (ref 8.4–10.4)
Chloride: 102 mEq/L (ref 98–109)
Glucose: 77 mg/dl (ref 70–140)
POTASSIUM: 4 meq/L (ref 3.5–5.1)
Sodium: 135 mEq/L — ABNORMAL LOW (ref 136–145)
Total Bilirubin: 0.38 mg/dL (ref 0.20–1.20)
Total Protein: 6.5 g/dL (ref 6.4–8.3)

## 2013-09-25 LAB — CBC WITH DIFFERENTIAL/PLATELET
BASO%: 1 % (ref 0.0–2.0)
Basophils Absolute: 0 10*3/uL (ref 0.0–0.1)
EOS%: 1.1 % (ref 0.0–7.0)
Eosinophils Absolute: 0 10*3/uL (ref 0.0–0.5)
HCT: 27.9 % — ABNORMAL LOW (ref 34.8–46.6)
HGB: 9.1 g/dL — ABNORMAL LOW (ref 11.6–15.9)
LYMPH%: 15.9 % (ref 14.0–49.7)
MCH: 31.6 pg (ref 25.1–34.0)
MCHC: 32.7 g/dL (ref 31.5–36.0)
MCV: 96.9 fL (ref 79.5–101.0)
MONO#: 0.5 10*3/uL (ref 0.1–0.9)
MONO%: 16.1 % — AB (ref 0.0–14.0)
NEUT#: 2.2 10*3/uL (ref 1.5–6.5)
NEUT%: 65.9 % (ref 38.4–76.8)
Platelets: 156 10*3/uL (ref 145–400)
RBC: 2.88 10*6/uL — AB (ref 3.70–5.45)
RDW: 20.4 % — AB (ref 11.2–14.5)
WBC: 3.3 10*3/uL — ABNORMAL LOW (ref 3.9–10.3)
lymph#: 0.5 10*3/uL — ABNORMAL LOW (ref 0.9–3.3)

## 2013-09-25 LAB — POCT INR: INR: 4.4

## 2013-09-25 LAB — PROTIME-INR
INR: 4.4 — AB (ref 2.00–3.50)
PROTIME: 52.8 s — AB (ref 10.6–13.4)

## 2013-09-25 NOTE — Progress Notes (Signed)
INR = 4.4 today - on Coumadin 5 mg daily Hgb = 9.1; Hct = 27.9 Na = 135 I spoke w/ pt over phone today (NO CHARGE).  She reports LLE being "fiery red from the knee down."  It is warm & painful to touch.  Her L ankle is "very swollen" and her R ankle is "puffed." Pt reports no fever. No blood in toilet when she has gone to the bathroom.  No unusual bruising. She was in the rehab unit at her facility from 5/13 through 6/1 w/ bronchitis/weakness/hyponatremia.  She was on abx until 7-10 days ago.  She is not on steroids. She has appt today w/ Dr. Lavone Orn (her PCP).  I called Blanch Media, RN and I faxed her the lab results from today. INR elevated w/ possible bleeding sxs.  I've instructed her to hold her Coumadin until she is here Thurs 6/25 for appt w/ Dr. Benay Spice.  We'll plan to see her prior to that visit in the Coumadin clinic. Kennith Center, Pharm.D., CPP 09/25/2013@3 :18 PM

## 2013-09-26 MED ORDER — LENALIDOMIDE 10 MG PO CAPS: 10.0000 mg | ORAL_CAPSULE | Freq: Every day | ORAL | Status: DC

## 2013-09-26 NOTE — Addendum Note (Signed)
Addended by: Wyonia Hough on: 09/26/2013 09:14 AM   Modules accepted: Orders

## 2013-09-27 LAB — KAPPA/LAMBDA LIGHT CHAINS
KAPPA FREE LGHT CHN: 6.74 mg/dL — AB (ref 0.33–1.94)
KAPPA LAMBDA RATIO: 6.42 — AB (ref 0.26–1.65)
LAMBDA FREE LGHT CHN: 1.05 mg/dL (ref 0.57–2.63)

## 2013-09-27 LAB — IGG, IGA, IGM
IGM, SERUM: 7 mg/dL — AB (ref 52–322)
IgA: 769 mg/dL — ABNORMAL HIGH (ref 69–380)
IgG (Immunoglobin G), Serum: 430 mg/dL — ABNORMAL LOW (ref 690–1700)

## 2013-09-28 ENCOUNTER — Other Ambulatory Visit (HOSPITAL_BASED_OUTPATIENT_CLINIC_OR_DEPARTMENT_OTHER): Payer: Medicare Other

## 2013-09-28 ENCOUNTER — Ambulatory Visit (HOSPITAL_BASED_OUTPATIENT_CLINIC_OR_DEPARTMENT_OTHER): Payer: Medicare Other | Admitting: Oncology

## 2013-09-28 ENCOUNTER — Ambulatory Visit: Payer: Medicare Other | Admitting: Pharmacist

## 2013-09-28 ENCOUNTER — Telehealth: Payer: Self-pay | Admitting: Pharmacist

## 2013-09-28 ENCOUNTER — Telehealth: Payer: Self-pay | Admitting: Oncology

## 2013-09-28 VITALS — BP 136/54 | HR 77 | Temp 97.5°F | Resp 17 | Ht 63.0 in | Wt 124.0 lb

## 2013-09-28 DIAGNOSIS — C9000 Multiple myeloma not having achieved remission: Secondary | ICD-10-CM

## 2013-09-28 DIAGNOSIS — I82409 Acute embolism and thrombosis of unspecified deep veins of unspecified lower extremity: Secondary | ICD-10-CM

## 2013-09-28 DIAGNOSIS — E871 Hypo-osmolality and hyponatremia: Secondary | ICD-10-CM

## 2013-09-28 DIAGNOSIS — L03119 Cellulitis of unspecified part of limb: Secondary | ICD-10-CM

## 2013-09-28 DIAGNOSIS — D630 Anemia in neoplastic disease: Secondary | ICD-10-CM

## 2013-09-28 DIAGNOSIS — L02419 Cutaneous abscess of limb, unspecified: Secondary | ICD-10-CM

## 2013-09-28 LAB — CBC WITH DIFFERENTIAL/PLATELET
BASO%: 0.9 % (ref 0.0–2.0)
Basophils Absolute: 0 10*3/uL (ref 0.0–0.1)
EOS ABS: 0 10*3/uL (ref 0.0–0.5)
EOS%: 0.5 % (ref 0.0–7.0)
HEMATOCRIT: 28 % — AB (ref 34.8–46.6)
HGB: 9.3 g/dL — ABNORMAL LOW (ref 11.6–15.9)
LYMPH%: 11.4 % — AB (ref 14.0–49.7)
MCH: 32.1 pg (ref 25.1–34.0)
MCHC: 33.2 g/dL (ref 31.5–36.0)
MCV: 96.5 fL (ref 79.5–101.0)
MONO#: 0.5 10*3/uL (ref 0.1–0.9)
MONO%: 13.5 % (ref 0.0–14.0)
NEUT%: 73.7 % (ref 38.4–76.8)
NEUTROS ABS: 3 10*3/uL (ref 1.5–6.5)
PLATELETS: 173 10*3/uL (ref 145–400)
RBC: 2.9 10*6/uL — ABNORMAL LOW (ref 3.70–5.45)
RDW: 19.9 % — ABNORMAL HIGH (ref 11.2–14.5)
WBC: 4 10*3/uL (ref 3.9–10.3)
lymph#: 0.5 10*3/uL — ABNORMAL LOW (ref 0.9–3.3)

## 2013-09-28 LAB — PROTIME-INR
INR: 1.4 — ABNORMAL LOW (ref 2.00–3.50)
Protime: 16.8 Seconds — ABNORMAL HIGH (ref 10.6–13.4)

## 2013-09-28 LAB — POCT INR: INR: 1.4

## 2013-09-28 NOTE — Progress Notes (Signed)
INR below goal after holding coumadin x 3 days Pt seen in clinic today and patient also to see Dr. Benay Spice Pt reports having a cellulitis/rash on lower left leg. She has received 3 penicillin injections so far for this  She states it was "fiery red with puss". It is currently wrapped and she has another visit today to have it assessed. Restart coumadin 5 mg daily. Recheck INR on Thursday 10/05/13 at 11am for lab.  Will also see if [facility] can check patient INR for her as this is more convenient

## 2013-09-28 NOTE — Telephone Encounter (Signed)
Ms. House called and stated she would like to have INR labs checked by [facility] nursing staff for convenience (this has been done in the past). I called and spoke to RN Mliss Sax at [phone] (fax [phone]) and faxed over prescription to have INR drawn via fingerstick by [facility]. INR will be drawn next week on 10/05/13 and the results will be called/faxed to the coumadin clinic  Thank you, Montel Clock, PharmD

## 2013-09-28 NOTE — Progress Notes (Signed)
  [place] OFFICE PROGRESS NOTE   Diagnosis: Multiple myeloma  INTERVAL HISTORY:   She returns today as scheduled. Her son reports she became weak and was diagnosed with hyponatremia several weeks ago. She was transferred to the rehabilitation unit at [facility] and given a saline infusion. She has improved and has returned to her home. She continues to have some difficulty with ambulation. She missed a cycle of Revlimid while in the rehabilitation unit.  She was diagnosed with a left leg cellulitis earlier this week and is being treated by Dr. Laurann Montana.  Objective:  Vital signs in last 24 hours:  Blood pressure 136/54, pulse 77, temperature 97.5 F (36.4 C), temperature source Oral, resp. rate 17, height _0  (1.6 m), weight 124 lb (56.246 kg), SpO2 95.00%.    Resp: Lungs clear bilaterally Cardio: Regular rate and rhythm GI: No hepatosplenomegaly, nontender Vascular: No right leg edema, Gauze dressing wrapped around the left lower leg Neuro: Alert, follows commands, the motor exam appears intact in the upper and lower extremities. She ambulates with a walker      Lab Results:  Lab Results  Component Value Date   WBC 4.0 09/28/2013   HGB 9.3* 09/28/2013   HCT 28.0* 09/28/2013   MCV 96.5 09/28/2013   PLT 173 09/28/2013   NEUTROABS 3.0 09/28/2013   09/25/2013-sodium 135, creatinine 0.7, albumin 3.3, total protein 6.5, IgA 769, kappa free light chain 6.74  Imaging:  No results found.  Medications: I have reviewed the patient's current medications.  Assessment/Plan: 1.IgA kappa multiple myeloma diagnosed in November 2012, initially treated with melphalan/prednisone through April of 2013 without a response  Initiation of single agent Revlimid July 2014, cycle on a 3 week on/one-week off schedule 2. Right pleural effusion, numerous plasmacytoid cells on cytology June 2014  3. Stage I ovarian cancer treated with a unilateral oophorectomy in may 2009  4.  History of C. difficile colitis  5. Degenerative arthritis  6. Hypothyroidism  7. Hypertension  8. Pneumonia October 2010  9. 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 Left leg cellulitis June 2015-currently maintained on antibiotics and followed by Dr. Laurann Montana 12. 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 The etiology of the recent episode of hyponatremia is unclear. The IgA and free kappa light chains were slightly higher 09/25/2013, but she missed a cycle of Revlimid while on the rehabilitation unit.  She will begin another cycle of Revlimid next week.  Heidi Pratt will continue followup with Dr. Laurann Montana for management of the left leg cellulitis.  She will return for an office and lab visit in one month.  [name], MD  09/28/2013  2:09 PM

## 2013-09-28 NOTE — Patient Instructions (Signed)
INR below goal after holding coumadin Restart coumadin 5 mg daily. Recheck INR on Thursday 10/05/13 at 11am for lab.  Will also see if [facility] can check patient INR for her as this is more convenient

## 2013-09-28 NOTE — Telephone Encounter (Signed)
gv and printed appt sched and avs for pt fro July °

## 2013-10-02 ENCOUNTER — Other Ambulatory Visit (HOSPITAL_BASED_OUTPATIENT_CLINIC_OR_DEPARTMENT_OTHER): Payer: Medicare Other

## 2013-10-02 ENCOUNTER — Ambulatory Visit: Payer: Medicare Other | Admitting: Pharmacist

## 2013-10-02 DIAGNOSIS — I82409 Acute embolism and thrombosis of unspecified deep veins of unspecified lower extremity: Secondary | ICD-10-CM

## 2013-10-02 DIAGNOSIS — I82402 Acute embolism and thrombosis of unspecified deep veins of left lower extremity: Secondary | ICD-10-CM

## 2013-10-02 DIAGNOSIS — C9000 Multiple myeloma not having achieved remission: Secondary | ICD-10-CM

## 2013-10-02 LAB — POCT INR: INR: 1.8

## 2013-10-02 LAB — PROTIME-INR
INR: 1.8 — ABNORMAL LOW (ref 2.00–3.50)
Protime: 21.6 Seconds — ABNORMAL HIGH (ref 10.6–13.4)

## 2013-10-02 NOTE — Progress Notes (Signed)
TELEPHONE ENCOUNTER ONLY - NO CHARGE  INR = 1.8     Goal range 2-3 INR is below goal range today. Patient has cellulitis of her left leg from knee to ankle and has been treated for the last several days with antibiotics. She received PCN benzathine inj daily last week, then has taken cephalexin 500 mg qid x 3 days which will end today. She will begin taking Revlimid today. She is back in her apartment at [facility]. She will continue Coumadin 5 mg daily. She will return on 10/09/13 for lab at 11:00 am. We will call her with results and instructions.  Theone Murdoch, PharmD

## 2013-10-04 ENCOUNTER — Telehealth: Payer: Self-pay | Admitting: Pharmacist

## 2013-10-04 NOTE — Telephone Encounter (Signed)
Pt declining to have INR by fingerstick at [facility] on 10/05/13 as ordered since she had INR done here on 10/02/13. I agreed. Pt did not need INR on 10/05/13. Order for 10/05/13 cancelled. She will return to [facility] for INR on 10/09/13. Mliss Sax said if pt would like for her INR to be done at [facility] in the future, please fax an order to [phone].

## 2013-10-09 ENCOUNTER — Ambulatory Visit (INDEPENDENT_AMBULATORY_CARE_PROVIDER_SITE_OTHER): Payer: Medicare Other | Admitting: Pharmacist

## 2013-10-09 ENCOUNTER — Other Ambulatory Visit (HOSPITAL_BASED_OUTPATIENT_CLINIC_OR_DEPARTMENT_OTHER): Payer: Medicare Other

## 2013-10-09 DIAGNOSIS — I82409 Acute embolism and thrombosis of unspecified deep veins of unspecified lower extremity: Secondary | ICD-10-CM

## 2013-10-09 DIAGNOSIS — C9000 Multiple myeloma not having achieved remission: Secondary | ICD-10-CM

## 2013-10-09 DIAGNOSIS — I82402 Acute embolism and thrombosis of unspecified deep veins of left lower extremity: Secondary | ICD-10-CM

## 2013-10-09 LAB — BASIC METABOLIC PANEL
BUN: 22 mg/dL — AB (ref 4–21)
CREATININE: 1.1 mg/dL (ref 0.5–1.1)
Glucose: 115 mg/dL
POTASSIUM: 4 mmol/L (ref 3.4–5.3)
Sodium: 130 mmol/L — AB (ref 137–147)

## 2013-10-09 LAB — CBC AND DIFFERENTIAL
HCT: 24 % — AB (ref 36–46)
Hemoglobin: 8.3 g/dL — AB (ref 12.0–16.0)
Platelets: 224 10*3/uL (ref 150–399)
WBC: 4.9 10^3/mL

## 2013-10-09 LAB — PROTIME-INR
INR: 2.8 (ref 2.00–3.50)
PROTIME: 33.6 s — AB (ref 10.6–13.4)

## 2013-10-09 LAB — POCT INR: INR: 2.8

## 2013-10-09 NOTE — Patient Instructions (Signed)
Continue Coumadin 5 mg daily. Recheck INR on Monday, July 20 at 9:15am for lab. We will call you at home with your results

## 2013-10-09 NOTE — Progress Notes (Signed)
Pt came in for lab only today Called her with INR results (of 2.8) LVM instructing patient to continue 5mg  daily and her it rechecked on Mon, July 20 Call us with any information or questions

## 2013-10-13 ENCOUNTER — Encounter: Payer: Self-pay | Admitting: Internal Medicine

## 2013-10-20 ENCOUNTER — Other Ambulatory Visit: Payer: Self-pay | Admitting: *Deleted

## 2013-10-20 DIAGNOSIS — C9000 Multiple myeloma not having achieved remission: Secondary | ICD-10-CM

## 2013-10-20 NOTE — Telephone Encounter (Signed)
THIS REFILL REQUEST FOR REVLIMID WAS GIVEN TO DR.SHERRILL'S NURSE, SUSAN COWARD,RN.

## 2013-10-23 ENCOUNTER — Encounter: Payer: Self-pay | Admitting: Pharmacist

## 2013-10-23 ENCOUNTER — Other Ambulatory Visit: Payer: Medicare Other

## 2013-10-23 MED ORDER — LENALIDOMIDE 10 MG PO CAPS: ORAL_CAPSULE | ORAL | Status: DC

## 2013-10-23 NOTE — Progress Notes (Signed)
Pt missed apt for lab today. She is scheduled to see Dr. Benay Spice on 10/26/13. Lab and Coumadin clinic appts added on 10/26/13. 10/26/13 = Lab = 11am, Coumadin clinic = 11:15am and Dr. Benay Spice apt  (already scheduled) @11 :30am. Left VM for patient.

## 2013-10-23 NOTE — Addendum Note (Signed)
Addended by: Wyonia Hough on: 10/23/2013 11:20 AM   Modules accepted: Orders

## 2013-10-23 NOTE — Addendum Note (Signed)
Addended by: Wyonia Hough on: 10/23/2013 01:36 PM   Modules accepted: Orders

## 2013-10-26 ENCOUNTER — Telehealth: Payer: Self-pay | Admitting: Oncology

## 2013-10-26 ENCOUNTER — Ambulatory Visit (HOSPITAL_BASED_OUTPATIENT_CLINIC_OR_DEPARTMENT_OTHER): Payer: Medicare Other | Admitting: Oncology

## 2013-10-26 ENCOUNTER — Other Ambulatory Visit (HOSPITAL_BASED_OUTPATIENT_CLINIC_OR_DEPARTMENT_OTHER): Payer: Medicare Other

## 2013-10-26 ENCOUNTER — Ambulatory Visit (HOSPITAL_BASED_OUTPATIENT_CLINIC_OR_DEPARTMENT_OTHER): Payer: Self-pay | Admitting: Pharmacist

## 2013-10-26 VITALS — BP 137/54 | HR 73 | Temp 98.0°F | Resp 17 | Ht 63.0 in | Wt 125.3 lb

## 2013-10-26 DIAGNOSIS — Z7901 Long term (current) use of anticoagulants: Secondary | ICD-10-CM

## 2013-10-26 DIAGNOSIS — C9000 Multiple myeloma not having achieved remission: Secondary | ICD-10-CM

## 2013-10-26 DIAGNOSIS — I82402 Acute embolism and thrombosis of unspecified deep veins of left lower extremity: Secondary | ICD-10-CM

## 2013-10-26 DIAGNOSIS — D630 Anemia in neoplastic disease: Secondary | ICD-10-CM

## 2013-10-26 DIAGNOSIS — Z8543 Personal history of malignant neoplasm of ovary: Secondary | ICD-10-CM

## 2013-10-26 DIAGNOSIS — I82409 Acute embolism and thrombosis of unspecified deep veins of unspecified lower extremity: Secondary | ICD-10-CM

## 2013-10-26 LAB — POCT INR: INR: 2.93

## 2013-10-26 NOTE — Patient Instructions (Signed)
INR at goal No changes Continue Coumadin 5 mg daily. Recheck INR on Tuesday 10/31/13 for lab with [facility].  We will call you at home with your results once received.

## 2013-10-26 NOTE — Telephone Encounter (Signed)
Pt confirmed labs/ov POF, gave pt AVS...Marland KitchenMarland KitchenKJ

## 2013-10-26 NOTE — Progress Notes (Signed)
  [place] OFFICE PROGRESS NOTE   Diagnosis: Multiple myeloma  INTERVAL HISTORY:   Heidi Pratt returns as scheduled. She is due to start another cycle of Revlimid on 10/30/2013. She continues treatment of the left leg cellulitis. Nurses are changing the left lower leg dressing daily. The left leg edema has improved. She is followed by Dr. Laurann Montana. She complains of a dry mouth.  Objective:  Vital signs in last 24 hours:  Blood pressure 137/54, pulse 73, temperature 98 F (36.7 C), temperature source Oral, resp. rate 17, height $RemoveBe'5\' 3"'abfkdSmdO$  (1.6 m), weight 125 lb 4.8 oz (56.836 kg), SpO2 96.00%.    HEENT: No thrush or ulcers Resp: End inspiratory rales at the lower posterior chest bilaterally, no respiratory distress Cardio: Regular rate and rhythm GI: No hepatosplenomegaly Vascular: Pitting edema at the left leg below the knee with a gauze dressing in place      Lab Results:  Lab Results  Component Value Date   WBC 4.9 10/09/2013   HGB 8.3* 10/09/2013   HCT 24* 10/09/2013   MCV 96.5 09/28/2013   PLT 224 10/09/2013   NEUTROABS 3.0 09/28/2013    10/23/2013-hemoglobin 7.8, platelets 145,000, white count 2.5, ANC 1.1 IgA 547, kappa free light chain 7.36 PT/INR 2.93 Medications: I have reviewed the patient's current medications.  Assessment/Plan: 1.IgA kappa multiple myeloma diagnosed in November 2012, initially treated with melphalan/prednisone through April of 2013 without a response  Initiation of single agent Revlimid July 2014, cycle on a 3 week on/one-week off schedule 2. Right pleural effusion, numerous plasmacytoid cells on cytology June 2014  3. Stage I ovarian cancer treated with a unilateral oophorectomy in may 2009  4. History of C. difficile colitis  5. Degenerative arthritis  6. Hypothyroidism  7. Hypertension  8. Pneumonia October 2010  9. 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 Left leg cellulitis June 2015- followed by Dr. Laurann Montana  12. 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 The hemoglobin and neutrophil count were lower on 10/23/2013. This is likely secondary to Revlimid. We Decided to place Revlimid on hold until she has a followup CBC 11/06/2013. She will resume Revlimid if the hemoglobin and neutrophil count are improved.  She continues followup with Dr. Laurann Montana for management of the left leg infection. She is maintained on Coumadin anticoagulation and followed at the [facility] anticoagulation clinic. The IgA and kappa free light chains were not changed significantly 10/23/2013.  She will return for an office visit 12/05/2013.  [name], MD  10/26/2013  11:50 AM

## 2013-10-26 NOTE — Progress Notes (Signed)
*  Telephone - No charge* INR at goal from 7/20 Pt having labs drawn via assisted living at [facility] It appears Dr. Nyoka Cowden at [facility] is managing the coumadin there as Well We will follow behind peripherally for INR monitoring Pt scheduled to have INR drawn next week Plan: Continue Coumadin 5 mg daily. Recheck INR on Tuesday 10/31/13 for lab with [facility]. We will call you at home with your results once received.  Can Call [place] at [phone] for INR results if not called in  Thanks,  Montel Clock, PharmD, BCOP

## 2013-10-31 ENCOUNTER — Telehealth: Payer: Self-pay | Admitting: *Deleted

## 2013-10-31 ENCOUNTER — Ambulatory Visit (INDEPENDENT_AMBULATORY_CARE_PROVIDER_SITE_OTHER): Payer: Self-pay | Admitting: Pharmacist

## 2013-10-31 DIAGNOSIS — I82409 Acute embolism and thrombosis of unspecified deep veins of unspecified lower extremity: Secondary | ICD-10-CM

## 2013-10-31 DIAGNOSIS — C9000 Multiple myeloma not having achieved remission: Secondary | ICD-10-CM

## 2013-10-31 LAB — POCT INR: INR: 3.59

## 2013-10-31 NOTE — Progress Notes (Signed)
INR = 3.59 (drawn at [place] today) Pt on Coumadin 5 mg daily. I s/w Ralene Cork RN (ph# [phone]) PER Dr. Nyoka Cowden at [facility]: Hold x 2 doses then recheck INR on Thursday 11/02/13 for lab with [facility].   They will fax [place] results just so we will know any dose adjustments made while in assisted living.  No need to call them back Thursday, this is for our information only. We can follow along in the background so pt will not be lost to follow-up and we will know what her doses have been prior to discharge back home.   It was not clear how long the plan is currently for pt to remain in assisted living but they do plan for her to return home. PHONE [place]. Kennith Center, Pharm.D., CPP 10/31/2013@3 :56 PM

## 2013-10-31 NOTE — Telephone Encounter (Signed)
INR today is 3.59. Dr. Nyoka Cowden has ordered to hold coumadin today and tomorrow. Will recheck on 7/30. Forwarded this information to [place] clinic pharmacist.

## 2013-11-01 LAB — POCT INR: INR: 1.98

## 2013-11-02 ENCOUNTER — Ambulatory Visit (INDEPENDENT_AMBULATORY_CARE_PROVIDER_SITE_OTHER): Payer: Self-pay | Admitting: Pharmacist

## 2013-11-02 DIAGNOSIS — C9000 Multiple myeloma not having achieved remission: Secondary | ICD-10-CM

## 2013-11-03 NOTE — Progress Notes (Addendum)
PER Dr. Nyoka Cowden at [facility]:  Resume coumadin at 4mg  daily and recheck INR in 1 week at [facility].   They will fax [place] results just so we will know any dose adjustments made while in assisted living.  No charge encounter  [place] Fax:  [phone],  [place] Phone:  [phone]

## 2013-11-03 NOTE — Patient Instructions (Signed)
PER Dr. Nyoka Cowden at [facility]:  Resume coumadin at 4mg  daily and recheck INR in 1 week at [facility].

## 2013-11-07 ENCOUNTER — Ambulatory Visit: Payer: Medicare Other | Admitting: Pharmacist

## 2013-11-07 DIAGNOSIS — C9000 Multiple myeloma not having achieved remission: Secondary | ICD-10-CM

## 2013-11-07 LAB — POCT INR: INR: 1.49

## 2013-11-07 NOTE — Progress Notes (Signed)
*  Telephone Encounter - No Charge* INR below goal today on 4 mg daily Received INR fax results and telephone call from [place], RN from [facility] (Fax: [phone], Phone: [phone]) Dr. Nyoka Cowden is no longer managing coumadin therapy per nurse [place] [facility] resuming care Spoke to [place] over the phone, patient is doing well with no issues or complaints No unusual bleeding or bruising No missed or extra doses No S/Sx of clotting No medication or diet changes Pt was previously stable on 5 mg daily (35 mg weekly dose), now subtherapeutic on 4 mg daily (28 mg weekly dose) after supratherapeutic levels this past month Tammy, RN states that patient has 4 mg tablets available at this time. I will increase her dose half way inbetween a 28 mg and 35 mg weekly dose and recheck in 1 week  Plan:   Increase coumadin to 4mg  daily except for 6 mg on Tuesdays and Thursdays (32 mg weekly dose) and recheck INR in 1 week  at [facility].  They will fax [place] results or call it in

## 2013-11-07 NOTE — Patient Instructions (Signed)
INR below goal on 4 mg daily [place] [facility] resuming care:   Increase coumadin to 4mg  daily except for 6 mg on Tuesdays and Thursdays and recheck INR in 1 week  at [facility].  They will fax [place] results or call it in

## 2013-11-13 ENCOUNTER — Encounter (HOSPITAL_BASED_OUTPATIENT_CLINIC_OR_DEPARTMENT_OTHER): Payer: Medicare Other | Attending: Plastic Surgery

## 2013-11-13 DIAGNOSIS — Z96649 Presence of unspecified artificial hip joint: Secondary | ICD-10-CM | POA: Diagnosis not present

## 2013-11-13 DIAGNOSIS — E039 Hypothyroidism, unspecified: Secondary | ICD-10-CM | POA: Diagnosis not present

## 2013-11-13 DIAGNOSIS — I872 Venous insufficiency (chronic) (peripheral): Secondary | ICD-10-CM | POA: Insufficient documentation

## 2013-11-13 DIAGNOSIS — I739 Peripheral vascular disease, unspecified: Secondary | ICD-10-CM | POA: Diagnosis not present

## 2013-11-13 DIAGNOSIS — Z86718 Personal history of other venous thrombosis and embolism: Secondary | ICD-10-CM | POA: Insufficient documentation

## 2013-11-13 DIAGNOSIS — L97809 Non-pressure chronic ulcer of other part of unspecified lower leg with unspecified severity: Secondary | ICD-10-CM | POA: Insufficient documentation

## 2013-11-13 DIAGNOSIS — I10 Essential (primary) hypertension: Secondary | ICD-10-CM | POA: Insufficient documentation

## 2013-11-13 DIAGNOSIS — Z7901 Long term (current) use of anticoagulants: Secondary | ICD-10-CM | POA: Insufficient documentation

## 2013-11-13 DIAGNOSIS — Z96659 Presence of unspecified artificial knee joint: Secondary | ICD-10-CM | POA: Diagnosis not present

## 2013-11-14 ENCOUNTER — Ambulatory Visit (INDEPENDENT_AMBULATORY_CARE_PROVIDER_SITE_OTHER): Payer: Self-pay | Admitting: Pharmacist

## 2013-11-14 DIAGNOSIS — C9000 Multiple myeloma not having achieved remission: Secondary | ICD-10-CM

## 2013-11-14 DIAGNOSIS — I82409 Acute embolism and thrombosis of unspecified deep veins of unspecified lower extremity: Secondary | ICD-10-CM

## 2013-11-14 LAB — POCT INR: INR: 1.93

## 2013-11-14 NOTE — Patient Instructions (Signed)
Continue coumadin 4mg  daily except for 6 mg on Tuesdays and Thursdays and recheck INR in 1 week (11/21/13) at [facility].

## 2013-11-14 NOTE — Progress Notes (Signed)
[facility]  NAME:  Heidi Pratt, [name]             ACCOUNT NO.:  [id number]  MEDICAL RECORD NO.:  [id number]      DATE OF BIRTH:  1918-11-25  PHYSICIAN:  Theodoro Kos, DO       VISIT DATE:  11/13/2013                                  OFFICE VISIT   HISTORY OF PRESENT ILLNESS:  The patient is a 80 year old female who is here with her son who was a former urologist at [employer]  She has multiple myeloma and has had some difficulty with her blood counts recently.  She noticed an open area on the left distal third of the leg and has been doing local care to the area with some improvement.  PAST MEDICAL HISTORY:  Positive for DVT, C. diff, anemia, hypertension, peripheral vascular disease, osteoarthritis, hypothyroidism, pleural effusion, multiple myeloma.  SURGERIES:  Left knee replacement, left hip replacement, laparoscopic oophorectomy, port site hernia, bilateral cataract removal, and appendectomy.  MEDICATIONS:  Norvasc, Coumadin, glucosamine, Bystolic, calcium, Lasix, iron, multivitamin, __________ Synthroid __________.  ALLERGIES:  AMOXICILLIN AND CRAB.  REVIEW OF SYSTEMS:  Negative otherwise stated.  FAMILY HISTORY AND SOCIAL HISTORY:  She is currently living at a nursing facility, but does want to go back to just assisted living.  She does have family support.  PHYSICAL EXAMINATION:  GENERAL:  On exam, she is alert, cooperative, and very pleasant.  She seems to be aware of her condition.  Her son tells her history. LUNGS:  Her breathing is nonlabored.  Her upper and lower extremity pulses are strong and regular. ABDOMEN:  Soft. NECK:  She does not have any cervical lymphadenopathy.  The wound sizes characteristics are noted in the chart.  It is located on the distal third of the left leg laterally, does not appear to be infected.  She has a little bit of periwound redness but overall minimal.  Recommendation is to check a prealbumin elevation,  multivitamin, vitamin C, Kerlix and an Ace wrap over Santyl, change the Santyl every other day.  See her back in a week.  Hopefully, we will be able to transition to collagen.     Theodoro Kos, DO     CS/MEDQ  D:  11/13/2013  T:  11/13/2013  Job:  [id number]

## 2013-11-14 NOTE — Progress Notes (Addendum)
INR essentially at goal today. No missed or extra coumadin doses. Pt was started on Santyl on 11/13/13. No problems regarding anticoagulation noted by nurse, Carlota. No s/s of clotting. Continue coumadin 4mg  daily except for 6 mg on Tuesdays and Thursdays and Recheck INR in 1 week (11/21/13) at [facility].   They will fax [place] results or call it in at [phone]. [facility] Phone = [phone], Fax = [phone]

## 2013-11-20 DIAGNOSIS — L97809 Non-pressure chronic ulcer of other part of unspecified lower leg with unspecified severity: Secondary | ICD-10-CM | POA: Diagnosis not present

## 2013-11-20 DIAGNOSIS — E039 Hypothyroidism, unspecified: Secondary | ICD-10-CM | POA: Diagnosis not present

## 2013-11-20 DIAGNOSIS — I872 Venous insufficiency (chronic) (peripheral): Secondary | ICD-10-CM | POA: Diagnosis not present

## 2013-11-20 DIAGNOSIS — I10 Essential (primary) hypertension: Secondary | ICD-10-CM | POA: Diagnosis not present

## 2013-11-20 NOTE — Progress Notes (Signed)
[facility]  NAME:  Heidi Pratt, Heidi Pratt             ACCOUNT NO.:  [id number]  MEDICAL RECORD NO.:  [id number]      DATE OF BIRTH:  03-Sep-1918  PHYSICIAN:  Theodoro Kos, DO       VISIT DATE:  11/20/2013                                  OFFICE VISIT   The patient is a 80 year old female, who is here for followup on her left lower extremity chronic venous insufficiency ulcers.  Overall, she is doing very well.  There is some improvement in the overall appearance.  There is decreased redness, decreased swelling, and the wound appears a little bit smaller.  There is no change in her medications.  SOCIAL HISTORY:  She is now back in the assisted-living facility and is quite happy about that.  On exam, GENERAL:  She is alert, oriented, cooperative, very pleasant. HEENT:  Pupils are equal. CHEST:  Her breathing is unlabored. HEART:  Her heart rate is regular. EXTREMITIES:  The pulse in the lower extremity is strong and regular.  She has improvement as described above.  We will continue with the local care but we will do collagen, Kerlix, and a wrap and this can be changed every other day at the facility and we will see her back in a week.     Theodoro Kos, DO     CS/MEDQ  D:  11/20/2013  T:  11/20/2013  Job:  [id number]

## 2013-11-22 ENCOUNTER — Ambulatory Visit (INDEPENDENT_AMBULATORY_CARE_PROVIDER_SITE_OTHER): Payer: Self-pay | Admitting: Pharmacist

## 2013-11-22 DIAGNOSIS — C9000 Multiple myeloma not having achieved remission: Secondary | ICD-10-CM

## 2013-11-22 DIAGNOSIS — I82409 Acute embolism and thrombosis of unspecified deep veins of unspecified lower extremity: Secondary | ICD-10-CM

## 2013-11-22 LAB — POCT INR: INR: 2.2

## 2013-11-22 NOTE — Patient Instructions (Addendum)
INR at goal Continue coumadin 4mg  daily except for 6 mg on Tuesdays and Thursdays and  recheck INR in 1 week (11/30/13) a [place] with scheduled lab appointment.

## 2013-11-22 NOTE — Progress Notes (Signed)
INR at goal *Telephone encounter - No Charge* Spoke with [facility] RN Mliss Sax at [phone]  Pt is doing well with no complaints INR back to goal No unusual bleeding or bruising No missed or extra doses No medication or diet changes Ms. Graham will be coming to [facility] for lab appointment next week. We will check INR at this time We can then fax in new orders for request for INR check to [facility] at: Fax number: [phone] (Attn: Clinic Nurse) Ms. Harr has follow up with Dr. Benay Spice on 12/05/13 Plan for now: Continue coumadin 4mg  daily except for 6 mg on Tuesdays and Thursdays and recheck INR in 1 week (11/30/13) a [place] with scheduled lab appointment.

## 2013-11-24 ENCOUNTER — Other Ambulatory Visit: Payer: Self-pay | Admitting: *Deleted

## 2013-11-24 ENCOUNTER — Other Ambulatory Visit: Payer: Self-pay | Admitting: Pharmacist

## 2013-11-24 DIAGNOSIS — C9000 Multiple myeloma not having achieved remission: Secondary | ICD-10-CM

## 2013-11-24 MED ORDER — WARFARIN SODIUM 6 MG PO TABS: ORAL_TABLET | ORAL | Status: DC

## 2013-11-24 MED ORDER — WARFARIN SODIUM 4 MG PO TABS: ORAL_TABLET | ORAL | Status: DC

## 2013-11-24 NOTE — Telephone Encounter (Signed)
Warfarin refills called to the [facility] on [address]. Warfarin 4mg  and 6mg  tablets. Phone: [phone]

## 2013-11-24 NOTE — Telephone Encounter (Signed)
THIS REFILL REQUEST FOR REVLIMID WAS PLACED ON DR.SHERRILL'S DESK.

## 2013-11-29 ENCOUNTER — Telehealth: Payer: Self-pay | Admitting: *Deleted

## 2013-11-29 ENCOUNTER — Encounter: Payer: Self-pay | Admitting: *Deleted

## 2013-11-29 NOTE — Telephone Encounter (Signed)
Refill request received, taken to Dr Benay Spice to review by Triage RN

## 2013-11-29 NOTE — Progress Notes (Signed)
Refill request is for Revlimid

## 2013-11-29 NOTE — Telephone Encounter (Signed)
Per Dr. Benay Spice; spoke with pt re: Revlimid.  Pt states she was instructed by office to hold Revlimid until next office visit 12/05/13 and evaluation then.  "I'm still going to the wound center for my leg wound to heal---do I need to start it back?  Explained to pt office will confirm with MD and call back tomorrow. Pt states "I have a whole bottle here"  Note to Dr. Benay Spice.

## 2013-11-30 ENCOUNTER — Other Ambulatory Visit (HOSPITAL_BASED_OUTPATIENT_CLINIC_OR_DEPARTMENT_OTHER): Payer: Medicare Other

## 2013-11-30 ENCOUNTER — Telehealth: Payer: Self-pay | Admitting: *Deleted

## 2013-11-30 ENCOUNTER — Ambulatory Visit: Payer: Medicare Other | Admitting: Pharmacist

## 2013-11-30 DIAGNOSIS — I82409 Acute embolism and thrombosis of unspecified deep veins of unspecified lower extremity: Secondary | ICD-10-CM

## 2013-11-30 DIAGNOSIS — C9000 Multiple myeloma not having achieved remission: Secondary | ICD-10-CM

## 2013-11-30 DIAGNOSIS — I82402 Acute embolism and thrombosis of unspecified deep veins of left lower extremity: Secondary | ICD-10-CM

## 2013-11-30 LAB — CBC WITH DIFFERENTIAL/PLATELET
BASO%: 0.5 % (ref 0.0–2.0)
Basophils Absolute: 0 10*3/uL (ref 0.0–0.1)
EOS ABS: 0 10*3/uL (ref 0.0–0.5)
EOS%: 0.6 % (ref 0.0–7.0)
HEMATOCRIT: 29.2 % — AB (ref 34.8–46.6)
HGB: 9.6 g/dL — ABNORMAL LOW (ref 11.6–15.9)
LYMPH%: 14.6 % (ref 14.0–49.7)
MCH: 32.4 pg (ref 25.1–34.0)
MCHC: 32.9 g/dL (ref 31.5–36.0)
MCV: 98.4 fL (ref 79.5–101.0)
MONO#: 0.5 10*3/uL (ref 0.1–0.9)
MONO%: 11.4 % (ref 0.0–14.0)
NEUT#: 3.3 10*3/uL (ref 1.5–6.5)
NEUT%: 72.9 % (ref 38.4–76.8)
Platelets: 188 10*3/uL (ref 145–400)
RBC: 2.96 10*6/uL — ABNORMAL LOW (ref 3.70–5.45)
RDW: 20.4 % — ABNORMAL HIGH (ref 11.2–14.5)
WBC: 4.5 10*3/uL (ref 3.9–10.3)
lymph#: 0.7 10*3/uL — ABNORMAL LOW (ref 0.9–3.3)

## 2013-11-30 LAB — BASIC METABOLIC PANEL (CC13)
Anion Gap: 9 mEq/L (ref 3–11)
BUN: 19.2 mg/dL (ref 7.0–26.0)
CALCIUM: 9.5 mg/dL (ref 8.4–10.4)
CO2: 27 mEq/L (ref 22–29)
Chloride: 100 mEq/L (ref 98–109)
Creatinine: 0.8 mg/dL (ref 0.6–1.1)
Glucose: 80 mg/dl (ref 70–140)
Potassium: 4.6 mEq/L (ref 3.5–5.1)
Sodium: 136 mEq/L (ref 136–145)

## 2013-11-30 LAB — PROTIME-INR
INR: 2.3 (ref 2.00–3.50)
Protime: 27.6 Seconds — ABNORMAL HIGH (ref 10.6–13.4)

## 2013-11-30 LAB — POCT INR: INR: 2.3

## 2013-11-30 NOTE — Telephone Encounter (Signed)
Encounter was telephone call. 

## 2013-11-30 NOTE — Progress Notes (Signed)
INR = 2.3 on Coumadin 4 mg daily except 6 mg on Tues/Thurs. I s/w Mliss Sax, RN at [facility] over the phone today.  She reports pt having no problems re: anticoag. She confirmed current Coumadin dose.  She will call pt this afternoon to report results. No change to Coumadin dose. Repeat INR in 3 weeks (12/21/13)- lab order faxed to [place] attn: clinic nurse (ph# [phone]; fax# [phone]) w/ specific instructions for fingerstick (pt preference).  They will fax [place] results. NO CHARGE - phone encounter Kennith Center, Pharm.D., CPP 11/30/2013@1 :04 PM

## 2013-11-30 NOTE — Telephone Encounter (Signed)
Called patient and confirmed that Dr. Benay Spice wants her to hold her Revlimid until he sees her on 12/05/13. She understands and agrees. Will hold refill request from [place] at desk.

## 2013-12-01 LAB — KAPPA/LAMBDA LIGHT CHAINS
Kappa free light chain: 9.66 mg/dL — ABNORMAL HIGH (ref 0.33–1.94)
Kappa:Lambda Ratio: 13.8 — ABNORMAL HIGH (ref 0.26–1.65)
Lambda Free Lght Chn: 0.7 mg/dL (ref 0.57–2.63)

## 2013-12-01 LAB — IGA: IgA: 817 mg/dL — ABNORMAL HIGH (ref 69–380)

## 2013-12-04 DIAGNOSIS — E039 Hypothyroidism, unspecified: Secondary | ICD-10-CM | POA: Diagnosis not present

## 2013-12-04 DIAGNOSIS — L97809 Non-pressure chronic ulcer of other part of unspecified lower leg with unspecified severity: Secondary | ICD-10-CM | POA: Diagnosis not present

## 2013-12-04 DIAGNOSIS — I872 Venous insufficiency (chronic) (peripheral): Secondary | ICD-10-CM | POA: Diagnosis not present

## 2013-12-04 DIAGNOSIS — I10 Essential (primary) hypertension: Secondary | ICD-10-CM | POA: Diagnosis not present

## 2013-12-05 ENCOUNTER — Ambulatory Visit (HOSPITAL_BASED_OUTPATIENT_CLINIC_OR_DEPARTMENT_OTHER): Payer: Medicare Other | Admitting: Oncology

## 2013-12-05 ENCOUNTER — Telehealth: Payer: Self-pay | Admitting: Oncology

## 2013-12-05 VITALS — BP 153/58 | HR 84 | Temp 97.0°F | Resp 17 | Ht 63.0 in | Wt 126.3 lb

## 2013-12-05 DIAGNOSIS — J90 Pleural effusion, not elsewhere classified: Secondary | ICD-10-CM

## 2013-12-05 DIAGNOSIS — D6481 Anemia due to antineoplastic chemotherapy: Secondary | ICD-10-CM

## 2013-12-05 DIAGNOSIS — Z86718 Personal history of other venous thrombosis and embolism: Secondary | ICD-10-CM

## 2013-12-05 DIAGNOSIS — T451X5A Adverse effect of antineoplastic and immunosuppressive drugs, initial encounter: Secondary | ICD-10-CM

## 2013-12-05 DIAGNOSIS — Z7901 Long term (current) use of anticoagulants: Secondary | ICD-10-CM

## 2013-12-05 DIAGNOSIS — C9000 Multiple myeloma not having achieved remission: Secondary | ICD-10-CM

## 2013-12-05 NOTE — Progress Notes (Signed)
  [place] OFFICE PROGRESS NOTE   Diagnosis: Multiple myeloma  INTERVAL HISTORY:   She returns as scheduled. Revlimid remains on hold secondary to neutropenia and the left leg skin breakdown. She reports the left leg has almost completely healed. She continues followup at the wound clinic. She had mid back pain for the past 2-3 days. She relates this to "arthritis "and the pain is better today. She has returned to her home.  Objective:  Vital signs in last 24 hours:  Blood pressure 153/58, pulse 84, temperature 97 F (36.1 C), temperature source Oral, resp. rate 17, height _0  (1.6 m), weight 126 lb 4.8 oz (57.289 kg), SpO2 98.00%.    Resp: Lungs with good air movement bilaterally, scattered inspiratory rhonchi, no respiratory distress Cardio: Regular rate and rhythm GI: No hepatosplenomegaly Vascular: No leg edema, Ace wrap in place at the left lower leg   Lab Results:  Lab Results  Component Value Date   WBC 4.5 11/30/2013   HGB 9.6* 11/30/2013   HCT 29.2* 11/30/2013   MCV 98.4 11/30/2013   PLT 188 11/30/2013   NEUTROABS 3.3 11/30/2013    PT/INR 2.3   Ig A 817, free kappa light chains 9.66    Medications: I have reviewed the patient's current medications.  Assessment/Plan: 1.IgA kappa multiple myeloma diagnosed in November 2012, initially treated with melphalan/prednisone through April of 2013 without a response  Initiation of single agent Revlimid July 2014, cycle on a 3 week on/one-week off schedule Revlimid placed on hold July 2015 secondary to neutropenia and a left leg cellulitis Revlimid resumed 12/05/2013 2. Right pleural effusion, numerous plasmacytoid cells on cytology June 2014  3. Stage I ovarian cancer treated with a unilateral oophorectomy in may 2009  4. History of C. difficile colitis  5. Degenerative arthritis  6. Hypothyroidism  7. Hypertension  8. Pneumonia October 2010  9. 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 Left leg cellulitis June 2015- followed by Dr. Laurann Montana and at the wound clinic 12. 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 The neutrophil count has recovered. The leg is healing and IgA level is higher. We decided to resume Revlimid. She will contact us for a fever progression of the left leg ulcer. She will return for office and lab visit in one month.  She continues followup at the [facility] anticoagulation clinic for management of the Coumadin.  [name], MD  12/05/2013  10:39 AM

## 2013-12-05 NOTE — Progress Notes (Signed)
[facility]  NAME:  Heidi Pratt, Heidi Pratt             ACCOUNT NO.:  [id number]  MEDICAL RECORD NO.:  [id number]      DATE OF BIRTH:  [birth year]  PHYSICIAN:  Theodoro Kos, DO       VISIT DATE:  12/04/2013                                  OFFICE VISIT   The patient is a 80 year old female, who is here for followup on her left lower extremity.  She had collagen with an Ace wrap over the past week.  She is doing extremely well.  One area has completely healed. The anterior shin area is a little bit smaller.  She has decreased swelling and redness.  There is no change in her medications or social history.  On exam, GENERAL:  She is alert, oriented, cooperative, not in any distress.  She is very pleasant. HEENT:  Pupils are equal.  Extraocular muscles are intact. LUNGS:  Her breathing is unlabored. HEART:  Her heart rate is regular.  The wound is showing signs of improvement.  We will do collagen and a Profore this week and then have them removed the Profore in a week and do collagen and Ace wraps.  The clinic will be closed for the holiday.     Theodoro Kos, DO     CS/MEDQ  D:  12/04/2013  T:  12/05/2013  Job:  [id number]

## 2013-12-08 ENCOUNTER — Telehealth: Payer: Self-pay | Admitting: *Deleted

## 2013-12-08 DIAGNOSIS — C9000 Multiple myeloma not having achieved remission: Secondary | ICD-10-CM

## 2013-12-08 MED ORDER — LENALIDOMIDE 10 MG PO CAPS: ORAL_CAPSULE | ORAL | Status: DC

## 2013-12-08 NOTE — Telephone Encounter (Signed)
Call from [place] with [facility] reporting pt is confused as to why her Rx was sent to a new pharmacy. Noted Revlimid Rx has been filled through [facility] in the past. Sent on 9/2 to [facility]. Requested [facility] cancel Rx. Sent to them in error. Called pt informed her that Rx will be sent to [facility]. She voiced appreciation for the call.

## 2013-12-12 ENCOUNTER — Other Ambulatory Visit: Payer: Self-pay | Admitting: *Deleted

## 2013-12-12 DIAGNOSIS — C9000 Multiple myeloma not having achieved remission: Secondary | ICD-10-CM

## 2013-12-12 MED ORDER — POLYSACCHARIDE IRON COMPLEX 150 MG PO CAPS: 150.0000 mg | ORAL_CAPSULE | Freq: Two times a day (BID) | ORAL | Status: AC

## 2013-12-18 ENCOUNTER — Encounter (HOSPITAL_BASED_OUTPATIENT_CLINIC_OR_DEPARTMENT_OTHER): Payer: Medicare Other | Attending: Plastic Surgery

## 2013-12-18 DIAGNOSIS — L97809 Non-pressure chronic ulcer of other part of unspecified lower leg with unspecified severity: Secondary | ICD-10-CM | POA: Diagnosis not present

## 2013-12-18 DIAGNOSIS — I872 Venous insufficiency (chronic) (peripheral): Secondary | ICD-10-CM | POA: Diagnosis not present

## 2013-12-19 NOTE — Progress Notes (Signed)
[facility]  NAME:  Heidi Pratt, Heidi Pratt             ACCOUNT NO.:  [id number]  MEDICAL RECORD NO.:  [id number]      DATE OF BIRTH:  11/20/1918  PHYSICIAN:  Theodoro Kos, DO       VISIT DATE:  12/18/2013                                  OFFICE VISIT   The patient is a 80 year old female here for followup on her left leg ulcer, chronic venous insufficiency.  Overall, she is doing much better. The one area has completely healed.  There is still a very small ulcer with some fibrous tissue but no sign of infection.  No change in her social history or medications.  On exam, GENERAL:  She is alert, oriented, cooperative, not in any distress. RESPIRATORY:  Breathing is unlabored. HEART:  Rate is regular. EYES:  Her pupils are symmetric. ABDOMEN:  Soft.  The lesion was curetted to debride it.  I recommend Santyl, collagen wrap and follow up in a week.     Theodoro Kos, DO     CS/MEDQ  D:  12/18/2013  T:  12/19/2013  Job:  [id number]

## 2013-12-21 ENCOUNTER — Ambulatory Visit (INDEPENDENT_AMBULATORY_CARE_PROVIDER_SITE_OTHER): Payer: Medicare Other

## 2013-12-21 DIAGNOSIS — C9000 Multiple myeloma not having achieved remission: Secondary | ICD-10-CM

## 2013-12-21 DIAGNOSIS — Z86718 Personal history of other venous thrombosis and embolism: Secondary | ICD-10-CM

## 2013-12-21 DIAGNOSIS — Z7901 Long term (current) use of anticoagulants: Secondary | ICD-10-CM

## 2013-12-21 LAB — POCT INR: INR: 2.6

## 2013-12-21 NOTE — Progress Notes (Signed)
*  Telephone encounter* *No charge*   Received fax from [facility] today with Ms. Brumett's INR. INR was 2.6 today which is within her goal range of 2-3.  Spoke with Ms. Callins on the phone to inform her of the results. She states she's been doing fairly well, moving a little slower. She endorses some shortness of breath that is fairly new for her. I advised her to inform her doctors of this issue which she said she would. Other than the shortness of breath, she denies any other issues including bleeding or unusual bruising. No recent medication changes or dietary changes. No extra and/or missed doses of her Coumadin.  Plan: Continue Coumadin 6 mg on Tues/Thurs, 4 mg all other days. She has a lab appointment for Dr. Learta Codding which includes INR on 9/24. We will call her with her INR results and any necessary dosage adjustments. Will also have her INR checked at [facility] on 01/11/14, and have the results faxed to [place].

## 2013-12-25 DIAGNOSIS — I872 Venous insufficiency (chronic) (peripheral): Secondary | ICD-10-CM | POA: Diagnosis not present

## 2013-12-25 DIAGNOSIS — L97809 Non-pressure chronic ulcer of other part of unspecified lower leg with unspecified severity: Secondary | ICD-10-CM | POA: Diagnosis not present

## 2013-12-26 NOTE — Progress Notes (Signed)
[facility]  NAME:  Heidi Pratt, Heidi Pratt             ACCOUNT NO.:  [id number]  MEDICAL RECORD NO.:  [id number]      DATE OF BIRTH:  Jul 26, 1918  PHYSICIAN:  Theodoro Kos, DO       VISIT DATE:  12/25/2013                                  OFFICE VISIT   The patient is a 80 year old female, who is here with her son for followup on her left leg chronic venous insufficiency ulcer.  Overall, she has shown some improvement and doing better.  She has a very small ulcer.  She has been using Santyl, collagen, and a Profore Lite.  Due to her shower routine, she has been changing them on a regular basis every other day which is not optimal but okay.  There has been no change in her social history or her medications.  She is now back on the chemotherapy for her multiple myeloma, so we will have to keep an eye on this.  On exam, GENERAL:  She is alert and cooperative, very pleasant. HEENT:  Pupils are equal. CHEST:  Breathing is unlabored. HEART:  Heart rate is regular. ABDOMEN:  Soft and nontender. EXTREMITIES:  Her pulses are present.  The wound is described above and is showing signs of improvement with decreased redness and decreased swelling of the overall leg.  We will continue with Santyl, collagen, and Profore Lite.  We will see her back in 2 weeks.     Theodoro Kos, DO     CS/MEDQ  D:  12/25/2013  T:  12/25/2013  Job:  [id number]

## 2013-12-28 ENCOUNTER — Other Ambulatory Visit (HOSPITAL_BASED_OUTPATIENT_CLINIC_OR_DEPARTMENT_OTHER): Payer: Medicare Other

## 2013-12-28 DIAGNOSIS — C9000 Multiple myeloma not having achieved remission: Secondary | ICD-10-CM

## 2013-12-28 LAB — BASIC METABOLIC PANEL (CC13)
ANION GAP: 11 meq/L (ref 3–11)
BUN: 19.9 mg/dL (ref 7.0–26.0)
CALCIUM: 9.3 mg/dL (ref 8.4–10.4)
CO2: 27 mEq/L (ref 22–29)
CREATININE: 0.9 mg/dL (ref 0.6–1.1)
Chloride: 99 mEq/L (ref 98–109)
GLUCOSE: 87 mg/dL (ref 70–140)
Potassium: 4.4 mEq/L (ref 3.5–5.1)
SODIUM: 136 meq/L (ref 136–145)

## 2013-12-28 LAB — CBC WITH DIFFERENTIAL/PLATELET
BASO%: 0.7 % (ref 0.0–2.0)
Basophils Absolute: 0 10*3/uL (ref 0.0–0.1)
EOS%: 1 % (ref 0.0–7.0)
Eosinophils Absolute: 0 10*3/uL (ref 0.0–0.5)
HEMATOCRIT: 28.4 % — AB (ref 34.8–46.6)
HGB: 9.2 g/dL — ABNORMAL LOW (ref 11.6–15.9)
LYMPH%: 23.8 % (ref 14.0–49.7)
MCH: 31.6 pg (ref 25.1–34.0)
MCHC: 32.4 g/dL (ref 31.5–36.0)
MCV: 97.6 fL (ref 79.5–101.0)
MONO#: 0.4 10*3/uL (ref 0.1–0.9)
MONO%: 14.5 % — AB (ref 0.0–14.0)
NEUT#: 1.7 10*3/uL (ref 1.5–6.5)
NEUT%: 60 % (ref 38.4–76.8)
PLATELETS: 162 10*3/uL (ref 145–400)
RBC: 2.91 10*6/uL — AB (ref 3.70–5.45)
RDW: 18.5 % — ABNORMAL HIGH (ref 11.2–14.5)
WBC: 2.9 10*3/uL — ABNORMAL LOW (ref 3.9–10.3)
lymph#: 0.7 10*3/uL — ABNORMAL LOW (ref 0.9–3.3)

## 2013-12-29 LAB — IGA: IgA: 943 mg/dL — ABNORMAL HIGH (ref 69–380)

## 2013-12-29 LAB — KAPPA/LAMBDA LIGHT CHAINS
KAPPA LAMBDA RATIO: 8.22 — AB (ref 0.26–1.65)
Kappa free light chain: 7.23 mg/dL — ABNORMAL HIGH (ref 0.33–1.94)
Lambda Free Lght Chn: 0.88 mg/dL (ref 0.57–2.63)

## 2014-01-01 ENCOUNTER — Encounter: Payer: Self-pay | Admitting: Oncology

## 2014-01-01 ENCOUNTER — Ambulatory Visit (HOSPITAL_BASED_OUTPATIENT_CLINIC_OR_DEPARTMENT_OTHER): Payer: Medicare Other | Admitting: Oncology

## 2014-01-01 ENCOUNTER — Ambulatory Visit: Payer: Medicare Other | Admitting: Nurse Practitioner

## 2014-01-01 ENCOUNTER — Telehealth: Payer: Self-pay | Admitting: Oncology

## 2014-01-01 VITALS — BP 153/64 | HR 82 | Temp 98.2°F | Resp 18 | Ht 63.0 in | Wt 123.3 lb

## 2014-01-01 DIAGNOSIS — D6481 Anemia due to antineoplastic chemotherapy: Secondary | ICD-10-CM

## 2014-01-01 DIAGNOSIS — Z86718 Personal history of other venous thrombosis and embolism: Secondary | ICD-10-CM

## 2014-01-01 DIAGNOSIS — C9000 Multiple myeloma not having achieved remission: Secondary | ICD-10-CM

## 2014-01-01 DIAGNOSIS — T451X5A Adverse effect of antineoplastic and immunosuppressive drugs, initial encounter: Secondary | ICD-10-CM

## 2014-01-01 DIAGNOSIS — Z23 Encounter for immunization: Secondary | ICD-10-CM

## 2014-01-01 MED ORDER — INFLUENZA VAC SPLIT QUAD 0.5 ML IM SUSY
0.5000 mL | PREFILLED_SYRINGE | Freq: Once | INTRAMUSCULAR | Status: AC
  Administered 2014-01-01: 0.5 mL via INTRAMUSCULAR
  Filled 2014-01-01: qty 0.5

## 2014-01-01 NOTE — Progress Notes (Signed)
  [facility] OFFICE PROGRESS NOTE   Diagnosis: Multiple myeloma.   INTERVAL HISTORY:   She returns as scheduled. She completed another cycle of Revlimid on 12/25/2013. She has stable exertional dyspnea and arthralgias. The left leg wound is healing. She continues followup at the wound clinic.  Objective:  Vital signs in last 24 hours:  Blood pressure 153/64, pulse 82, temperature 98.2 F (36.8 C), temperature source Oral, resp. rate 18, height $RemoveBe'5\' 3"'uwxtpYDsV$  (1.6 m), weight 123 lb 4.8 oz (55.929 kg), SpO2 97.00%.    HEENT: No thrush or ulcers Resp: Scattered coarse and inspiratory rhonchi, slight decrease in breath sounds at the right lower chest, no respiratory distress Cardio: Regular rate and rhythm GI: No hepatosplenomegaly Vascular: No leg edema  Skin: Ace wrap over the left lower leg   Portacath/PICC-without erythema  Lab Results:  Lab Results  Component Value Date   WBC 2.9* 12/28/2013   HGB 9.2* 12/28/2013   HCT 28.4* 12/28/2013   MCV 97.6 12/28/2013   PLT 162 12/28/2013   NEUTROABS 1.7 12/28/2013   potassium 4.4, creatinine 0.9, calcium 9.3 IgA 943, free kappa light chains 7.23  12/21/2013-PT/INR 2.6  Medications: I have reviewed the patient's current medications.  Assessment/Plan: 1.IgA kappa multiple myeloma diagnosed in November 2012, initially treated with melphalan/prednisone through April of 2013 without a response  Initiation of single agent Revlimid July 2014, cycle on a 3 week on/one-week off schedule  Revlimid placed on hold July 2015 secondary to neutropenia and a left leg cellulitis  Revlimid resumed 12/05/2013 2. Right pleural effusion, numerous plasmacytoid cells on cytology June 2014  3. Stage I ovarian cancer treated with a unilateral oophorectomy in may 2009  4. History of C. difficile colitis  5. Degenerative arthritis  6. Hypothyroidism  7. Hypertension  8. Pneumonia October 2010  9. 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 Left leg cellulitis June 2015- followed by Dr. Laurann Montana and at the wound clinic for a healing wound at the left lower leg 12. 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 The IgA level is higher, but the free kappa light chains were lower on 12/28/2013. She is stable from a hematologic standpoint. Ms. Brosious missed several cycles of Revlimid the summer secondary to the left lower extremity cellulitis and neutropenia.  We decided to continue Revlimid for now. She will return for a lab visit in one month and an office visit in 2 months.  We will make a change to Velcade if there is clear evidence of disease progression. She will contact us for increased dyspnea and we will repeat a chest x-ray to look for reaccumulation of the pleural effusion. She received an influenza vaccine today.  [name], MD  01/01/2014  1:05 PM

## 2014-01-01 NOTE — Telephone Encounter (Signed)
Pt confirmed labs/ov per 09/28 POF, gave pt AVS...... KJ °

## 2014-01-03 ENCOUNTER — Other Ambulatory Visit: Payer: Self-pay | Admitting: *Deleted

## 2014-01-03 DIAGNOSIS — C9000 Multiple myeloma not having achieved remission: Secondary | ICD-10-CM

## 2014-01-03 MED ORDER — LENALIDOMIDE 10 MG PO CAPS: ORAL_CAPSULE | ORAL | Status: DC

## 2014-01-08 ENCOUNTER — Encounter (HOSPITAL_BASED_OUTPATIENT_CLINIC_OR_DEPARTMENT_OTHER): Payer: Medicare Other | Attending: Plastic Surgery

## 2014-01-08 DIAGNOSIS — I872 Venous insufficiency (chronic) (peripheral): Secondary | ICD-10-CM | POA: Diagnosis not present

## 2014-01-08 DIAGNOSIS — L97929 Non-pressure chronic ulcer of unspecified part of left lower leg with unspecified severity: Secondary | ICD-10-CM | POA: Insufficient documentation

## 2014-01-08 NOTE — Progress Notes (Signed)
[facility]  NAME:  Heidi Pratt, Heidi Pratt             ACCOUNT NO.:  [id number]  MEDICAL RECORD NO.:  [id number]      DATE OF BIRTH:  11/20/1918  PHYSICIAN:  Theodoro Kos, DO       VISIT DATE:  01/08/2014                                  OFFICE VISIT   The patient is a 80 year old female, who is here for followup on her left chronic venous insufficiency ulcer of the leg.  She underwent Santyl, collagen and a Profore Lite this past week.  She is getting it changed at least 1-2 times a week in order to be able to take her shower.  She shows marked improvement, no sign of infection.  REVIEW OF SYSTEMS:  Negative.  No change in medications or social history.  On exam, she is alert, oriented, cooperative, not in any acute distress. She is pleasant.  Pupils are equal.  Extraocular muscles are intact.  No problems of breathing.  Heart rate is regular.  Pulses are regular and palpable on the lower extremities.  The swelling, redness and periwound areas markedly improved.  The wound is just about healed.  We will continue with collagen, no Santyl or Profore Lite.  Continue elevation, multivitamins, and we will follow up in 1 week.     Theodoro Kos, DO     CS/MEDQ  D:  01/08/2014  T:  01/08/2014  Job:  [id number]

## 2014-01-11 ENCOUNTER — Ambulatory Visit: Payer: Medicare Other | Admitting: Pharmacist

## 2014-01-11 LAB — POCT INR: INR: 2.4

## 2014-01-11 NOTE — Progress Notes (Signed)
INR = 2.4 (drawn at [facility]) on Coumadin 6 mg on Tues/Thurs, 4 mg all other days I s/w RN at [employer] Mliss Sax) and she is unaware of any problems other than slowly healing L leg wound. INR at goal.  No change to Coumadin dose. Repeat protime in 1 month at [facility]. I called pt & left her a message w/ dosing instructions. I faxed orders to [facility]. NO CHARGE - phone encounter Kennith Center, Pharm.D., CPP 01/11/2014@4 :23 PM

## 2014-01-15 DIAGNOSIS — I872 Venous insufficiency (chronic) (peripheral): Secondary | ICD-10-CM | POA: Diagnosis not present

## 2014-01-15 DIAGNOSIS — L97929 Non-pressure chronic ulcer of unspecified part of left lower leg with unspecified severity: Secondary | ICD-10-CM | POA: Diagnosis not present

## 2014-01-15 NOTE — Progress Notes (Signed)
[facility]  NAME:  NIZHONI, PARLOW             ACCOUNT NO.:  [id number]  MEDICAL RECORD NO.:  [id number]      DATE OF BIRTH:  1918-07-25  PHYSICIAN:  Theodoro Kos, DO       VISIT DATE:  01/15/2014                                  OFFICE VISIT   The patient is a 80 year old female, who is here for a followup on her left lower extremity chronic venous insufficiency ulcer.  She is healed today.  There has been no change in her medications or social history. She is here with her son.  On exam, she is alert, oriented, cooperative, very pleasant as usual. She does have a sock on her right foot.  She is alert.  No signs of distress.  Her breathing is unlabored.  Her heart rate is regular.  The wound is healed.  I recommend that she continue with wearing the socks which have a round about 10 mmHg pressure.  Continue with protein, multivitamins.  Lotion of her lower extremities to help with her dry skin and we will see her back as needed.     Theodoro Kos, DO     CS/MEDQ  D:  01/15/2014  T:  01/15/2014  Job:  [id number]

## 2014-01-18 ENCOUNTER — Other Ambulatory Visit: Payer: Self-pay | Admitting: *Deleted

## 2014-01-18 DIAGNOSIS — C9000 Multiple myeloma not having achieved remission: Secondary | ICD-10-CM

## 2014-01-18 MED ORDER — WARFARIN SODIUM 6 MG PO TABS: ORAL_TABLET | ORAL | Status: DC

## 2014-01-18 MED ORDER — WARFARIN SODIUM 4 MG PO TABS: ORAL_TABLET | ORAL | Status: DC

## 2014-01-25 ENCOUNTER — Other Ambulatory Visit: Payer: Medicare Other

## 2014-02-07 ENCOUNTER — Other Ambulatory Visit: Payer: Self-pay | Admitting: *Deleted

## 2014-02-07 DIAGNOSIS — C9000 Multiple myeloma not having achieved remission: Secondary | ICD-10-CM

## 2014-02-07 MED ORDER — LENALIDOMIDE 10 MG PO CAPS: ORAL_CAPSULE | ORAL | Status: DC

## 2014-02-08 ENCOUNTER — Ambulatory Visit: Payer: Medicare Other | Admitting: Pharmacist

## 2014-02-08 DIAGNOSIS — Z7901 Long term (current) use of anticoagulants: Secondary | ICD-10-CM

## 2014-02-08 LAB — POCT INR: INR: 2.3

## 2014-02-08 NOTE — Patient Instructions (Signed)
INR at goal  *Telephone Encounter - No charge* Pt is doing well today Spoke to patient over the phone INR from [facility], [place], [place]). No unusual bleeding or bruising No missed or extra doses No diet or medication changes Plan:  Continue coumadin 4mg  daily except for 6 mg on Tuesdays and Thursdays.  Recheck INR at [facility] with lab appointment on 11/19 at 11:15 am. We will call with results Will also have her INR checked in December (03/15/14) at [facility] (ph# [phone]; fax# [phone])

## 2014-02-08 NOTE — Progress Notes (Signed)
INR at goal  *Telephone Encounter - No charge* Pt is doing well today Spoke to patient over the phone INR from [facility], [place], [place]). No unusual bleeding or bruising No missed or extra doses No diet or medication changes Plan:  Continue coumadin 4mg  daily except for 6 mg on Tuesdays and Thursdays.  Recheck INR at [facility] with lab appointment on 11/19 at 11:15 am. We will call with results Will also have her INR checked in December (03/15/14) at [facility] (ph# [phone]; fax# [phone])

## 2014-02-22 ENCOUNTER — Other Ambulatory Visit (HOSPITAL_BASED_OUTPATIENT_CLINIC_OR_DEPARTMENT_OTHER): Payer: Medicare Other

## 2014-02-22 ENCOUNTER — Telehealth: Payer: Self-pay | Admitting: Oncology

## 2014-02-22 ENCOUNTER — Ambulatory Visit: Payer: Medicare Other | Admitting: Pharmacist

## 2014-02-22 DIAGNOSIS — Z7901 Long term (current) use of anticoagulants: Secondary | ICD-10-CM

## 2014-02-22 DIAGNOSIS — C9000 Multiple myeloma not having achieved remission: Secondary | ICD-10-CM

## 2014-02-22 LAB — CBC WITH DIFFERENTIAL/PLATELET
BASO%: 0.6 % (ref 0.0–2.0)
BASOS ABS: 0 10*3/uL (ref 0.0–0.1)
EOS%: 7.1 % — AB (ref 0.0–7.0)
Eosinophils Absolute: 0.1 10*3/uL (ref 0.0–0.5)
HCT: 26.3 % — ABNORMAL LOW (ref 34.8–46.6)
HEMOGLOBIN: 8.5 g/dL — AB (ref 11.6–15.9)
LYMPH%: 23.1 % (ref 14.0–49.7)
MCH: 31.3 pg (ref 25.1–34.0)
MCHC: 32.3 g/dL (ref 31.5–36.0)
MCV: 96.7 fL (ref 79.5–101.0)
MONO#: 0.3 10*3/uL (ref 0.1–0.9)
MONO%: 19.2 % — AB (ref 0.0–14.0)
NEUT#: 0.8 10*3/uL — ABNORMAL LOW (ref 1.5–6.5)
NEUT%: 50 % (ref 38.4–76.8)
Platelets: 95 10*3/uL — ABNORMAL LOW (ref 145–400)
RBC: 2.72 10*6/uL — ABNORMAL LOW (ref 3.70–5.45)
RDW: 18.7 % — AB (ref 11.2–14.5)
WBC: 1.6 10*3/uL — ABNORMAL LOW (ref 3.9–10.3)
lymph#: 0.4 10*3/uL — ABNORMAL LOW (ref 0.9–3.3)

## 2014-02-22 LAB — POCT INR: INR: 4.7

## 2014-02-22 LAB — BASIC METABOLIC PANEL (CC13)
ANION GAP: 9 meq/L (ref 3–11)
BUN: 31.7 mg/dL — ABNORMAL HIGH (ref 7.0–26.0)
CO2: 29 mEq/L (ref 22–29)
CREATININE: 1.1 mg/dL (ref 0.6–1.1)
Calcium: 9.6 mg/dL (ref 8.4–10.4)
Chloride: 102 mEq/L (ref 98–109)
Glucose: 104 mg/dl (ref 70–140)
POTASSIUM: 3.9 meq/L (ref 3.5–5.1)
Sodium: 140 mEq/L (ref 136–145)

## 2014-02-22 LAB — PROTIME-INR
INR: 4.7 — ABNORMAL HIGH (ref 2.00–3.50)
Protime: 56.4 Seconds — ABNORMAL HIGH (ref 10.6–13.4)

## 2014-02-22 NOTE — Patient Instructions (Signed)
Hold coumadin today (11/19) and tomorrow (11/20).   On 11/21, continue coumadin 4mg  daily except for 6 mg on Tuesdays and Thursdays.  Recheck INR at [facility] with appointment on 11/23; Dr. Benay Spice at 9:30am and lab at 10:30am.   We will call you with your INR results.

## 2014-02-22 NOTE — Progress Notes (Signed)
INR above goal today. Hg/Hct:  8.5/26.3, Pltc:  95 Pt doing well without problems or concerns regarding anticoagulation. No unusual bruising or bleeding. No s/s of clotting noted. No significant changes in medications. She is on her off week for Revlimid. She hasn't been drinking wine with her evening meal like usual. She then celebrated her son's birthday on 11/17 and had more wine than usual. She has been eating less - things don't taste as good with the change in weather. Patient has been very stable on this coumadin dose. Hold coumadin today (11/19) and tomorrow (11/20).   On 11/21, continue coumadin 4mg  daily except for 6 mg on Tuesdays and Thursdays.  Recheck INR at [facility] with appointment on 11/23; Dr. Benay Spice at 9:30am and lab at 10:30am.   Pt is not a morning person but doesn't wasn't to be at [facility] any earlier than she needs to for Dr. Gearldine Shown appointment. Thus, lab appt is scheduled after MD appt on 02/26/14. Spoke with patient by telephone. NO charge encounter.

## 2014-02-22 NOTE — Telephone Encounter (Signed)
Please add lab appt on 02/26/14 at 10:30am.  Pt has a MD appt on the same day at 9:30am.  Pt doesn't want to come to [facility] any earlier than 9:30am thus lab is scheduled after MD appt. The lab appt is just for an INR and is not needed before MD appt. She had labs drawn today for her MD appt on 11/23.  She doesn't need a CC appt. We will call her with the results on 11/23.   Let me know if you have questions.   Thanks,  [employer]

## 2014-02-23 ENCOUNTER — Telehealth: Payer: Self-pay | Admitting: *Deleted

## 2014-02-23 ENCOUNTER — Other Ambulatory Visit: Payer: Self-pay | Admitting: *Deleted

## 2014-02-23 DIAGNOSIS — C9000 Multiple myeloma not having achieved remission: Secondary | ICD-10-CM

## 2014-02-23 DIAGNOSIS — Z7901 Long term (current) use of anticoagulants: Secondary | ICD-10-CM

## 2014-02-23 LAB — KAPPA/LAMBDA LIGHT CHAINS
Kappa free light chain: 3.85 mg/dL — ABNORMAL HIGH (ref 0.33–1.94)
Kappa:Lambda Ratio: 3.03 — ABNORMAL HIGH (ref 0.26–1.65)
Lambda Free Lght Chn: 1.27 mg/dL (ref 0.57–2.63)

## 2014-02-23 LAB — IGA: IGA: 831 mg/dL — AB (ref 69–380)

## 2014-02-23 NOTE — Telephone Encounter (Signed)
Unable to reach patient at her home #. Called son and gave him instructions to hold Revlimid and call for fever due to neutropenia and thrombocytopenia. He will notify patient, but thinks she was planning on holding it until she sees Dr. Benay Spice next week.

## 2014-02-23 NOTE — Telephone Encounter (Signed)
-----   Message from Ladell Pier, MD sent at 02/22/2014  8:58 PM EST ----- Please call patient, hold revlimid, call for fever, f/u as scheduled

## 2014-02-26 ENCOUNTER — Other Ambulatory Visit (HOSPITAL_BASED_OUTPATIENT_CLINIC_OR_DEPARTMENT_OTHER): Payer: Medicare Other

## 2014-02-26 ENCOUNTER — Ambulatory Visit: Payer: Medicare Other | Admitting: Pharmacist

## 2014-02-26 ENCOUNTER — Other Ambulatory Visit: Payer: Self-pay | Admitting: Oncology

## 2014-02-26 ENCOUNTER — Ambulatory Visit (HOSPITAL_COMMUNITY)
Admission: RE | Admit: 2014-02-26 | Discharge: 2014-02-26 | Disposition: A | Discharge status: Home / Self Care | Payer: Medicare Other | Source: Ambulatory Visit | Attending: Oncology | Admitting: Oncology

## 2014-02-26 ENCOUNTER — Telehealth: Payer: Self-pay | Admitting: *Deleted

## 2014-02-26 ENCOUNTER — Telehealth: Payer: Self-pay | Admitting: Oncology

## 2014-02-26 ENCOUNTER — Ambulatory Visit (HOSPITAL_BASED_OUTPATIENT_CLINIC_OR_DEPARTMENT_OTHER): Payer: Medicare Other | Admitting: Oncology

## 2014-02-26 VITALS — BP 152/68 | HR 84 | Temp 98.1°F | Resp 17 | Ht 63.0 in | Wt 118.3 lb

## 2014-02-26 DIAGNOSIS — C9000 Multiple myeloma not having achieved remission: Secondary | ICD-10-CM | POA: Diagnosis present

## 2014-02-26 DIAGNOSIS — Z8543 Personal history of malignant neoplasm of ovary: Secondary | ICD-10-CM

## 2014-02-26 DIAGNOSIS — R490 Dysphonia: Secondary | ICD-10-CM | POA: Diagnosis present

## 2014-02-26 DIAGNOSIS — Z7901 Long term (current) use of anticoagulants: Secondary | ICD-10-CM

## 2014-02-26 DIAGNOSIS — I82402 Acute embolism and thrombosis of unspecified deep veins of left lower extremity: Secondary | ICD-10-CM

## 2014-02-26 DIAGNOSIS — I10 Essential (primary) hypertension: Secondary | ICD-10-CM

## 2014-02-26 DIAGNOSIS — D630 Anemia in neoplastic disease: Secondary | ICD-10-CM

## 2014-02-26 DIAGNOSIS — R944 Abnormal results of kidney function studies: Secondary | ICD-10-CM

## 2014-02-26 DIAGNOSIS — D701 Agranulocytosis secondary to cancer chemotherapy: Secondary | ICD-10-CM

## 2014-02-26 DIAGNOSIS — R0602 Shortness of breath: Secondary | ICD-10-CM | POA: Diagnosis not present

## 2014-02-26 DIAGNOSIS — R682 Dry mouth, unspecified: Secondary | ICD-10-CM

## 2014-02-26 DIAGNOSIS — R0609 Other forms of dyspnea: Secondary | ICD-10-CM

## 2014-02-26 DIAGNOSIS — J90 Pleural effusion, not elsewhere classified: Secondary | ICD-10-CM

## 2014-02-26 DIAGNOSIS — E039 Hypothyroidism, unspecified: Secondary | ICD-10-CM

## 2014-02-26 LAB — CBC WITH DIFFERENTIAL/PLATELET
BASO%: 0.6 % (ref 0.0–2.0)
BASOS ABS: 0 10*3/uL (ref 0.0–0.1)
EOS ABS: 0.1 10*3/uL (ref 0.0–0.5)
EOS%: 2.9 % (ref 0.0–7.0)
HCT: 28.5 % — ABNORMAL LOW (ref 34.8–46.6)
HEMOGLOBIN: 9.1 g/dL — AB (ref 11.6–15.9)
LYMPH%: 17 % (ref 14.0–49.7)
MCH: 31.1 pg (ref 25.1–34.0)
MCHC: 31.9 g/dL (ref 31.5–36.0)
MCV: 97.3 fL (ref 79.5–101.0)
MONO#: 0.5 10*3/uL (ref 0.1–0.9)
MONO%: 14.7 % — ABNORMAL HIGH (ref 0.0–14.0)
NEUT#: 2 10*3/uL (ref 1.5–6.5)
NEUT%: 64.8 % (ref 38.4–76.8)
PLATELETS: 116 10*3/uL — AB (ref 145–400)
RBC: 2.93 10*6/uL — ABNORMAL LOW (ref 3.70–5.45)
RDW: 18.6 % — ABNORMAL HIGH (ref 11.2–14.5)
WBC: 3.1 10*3/uL — ABNORMAL LOW (ref 3.9–10.3)
lymph#: 0.5 10*3/uL — ABNORMAL LOW (ref 0.9–3.3)

## 2014-02-26 LAB — PROTIME-INR
INR: 2 (ref 2.00–3.50)
Protime: 24 Seconds — ABNORMAL HIGH (ref 10.6–13.4)

## 2014-02-26 LAB — POCT INR: INR: 2

## 2014-02-26 NOTE — Telephone Encounter (Signed)
gv and printed appt sched and avs for pt for DEC adn Jan ..Marland KitchenMarland KitchenMarland Kitchen

## 2014-02-26 NOTE — Telephone Encounter (Signed)
Received call report for chest xray done today.  Report printed and gave to Dr. Benay Spice.

## 2014-02-26 NOTE — Progress Notes (Signed)
**  Telephone encounter-no charge**  INR at lower end of goal today.  H/H = 9.1/28.5 Plt = 116k.  Heidi Pratt had an appt with Dr. Benay Spice today.  She stated that she had bruising and was sent for a chest xray.  Dr Benay Spice has stopped Lasix.  Heidi Pratt will continue coumadin 6mg  Tu-Th and 4mg  other days and will f/u PT/INR on 03/22/14

## 2014-02-26 NOTE — Progress Notes (Signed)
  [facility] OFFICE PROGRESS NOTE   Diagnosis: Multiple myeloma  INTERVAL HISTORY:   She returns as scheduled. She finished another cycle of Revlimid on 02/21/2014. Stable exertional dyspnea. She complains of a dry mouth. The left leg ulcer has healed.  Objective:  Vital signs in last 24 hours:  Blood pressure 152/68, pulse 84, temperature 98.1 F (36.7 C), temperature source Oral, resp. rate 17, height _0  (1.6 m), weight 118 lb 4.8 oz (53.661 kg).    HEENT: Pharynx is dry, no thrush or ulcers Resp: Distant breath sounds, scattered coarse rhonchi at the upper chest bilaterally, no respiratory distress Cardio: Regular rate and rhythm GI: No hepatosplenomegaly Vascular: No leg edema  Skin: Healed ulcer at the left pretibial region. Chronic discoloration at the left lower leg    Lab Results:  Lab Results  Component Value Date   WBC 1.6* 02/22/2014   HGB 8.5* 02/22/2014   HCT 26.3* 02/22/2014   MCV 96.7 02/22/2014   PLT 95* 02/22/2014   NEUTROABS 0.8* 02/22/2014   IgA 831, free kappa light chains 3.85  BUN 31.7, creatinine 1.1, potassium 3.9, sodium 140   Medications: I have reviewed the patient's current medications.  Assessment/Plan: 1.gA kappa multiple myeloma diagnosed in November 2012, initially treated with melphalan/prednisone through April of 2013 without a response   Initiation of single agent Revlimid July 2014, cycle on a 3 week on/one-week off schedule   Revlimid placed on hold July 2015 secondary to neutropenia and a left leg cellulitis   Revlimid resumed 12/05/2013 2. Right pleural effusion, numerous plasmacytoid cells on cytology June 2014  3. Stage I ovarian cancer treated with a unilateral oophorectomy in may 2009  4. History of C. difficile colitis  5. Degenerative arthritis  6. Hypothyroidism  7. Hypertension  8. Pneumonia October 2010  9. History of a positive PPD with granulomas on chest CT  10.Left lower  extremity DVT July 2014-maintained on Coumadin  11. Left leg cellulitis June 2015- the left leg wound has healed 12. 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 Neutropenia secondary to Revlimid    Disposition:  The myeloma laboratory panel indicates stable disease. She is more anemic. This may be secondary to Revlimid versus multiple myeloma. She will contact us for increased dyspnea. Her mouth is dry and the BUN/creatinine is elevated. She has no edema on exam today. She will hold Lasix to see if this helps with the dry mouth and her energy level.  We will check a CBC today to be sure it is okay to resume Revlimid on schedule later this week.  Ms. Mally will return for a lab visit in one month and an office visit in 2 months.  We will check a chest x-ray today to follow-up on the right effusion and her complain of exertional dyspnea.  She continues close follow-up at the [facility] Coumadin clinic.  [name], MD  02/26/2014  9:46 AM

## 2014-02-27 ENCOUNTER — Telehealth: Payer: Self-pay | Admitting: Oncology

## 2014-02-27 NOTE — Telephone Encounter (Signed)
S/w pt confirming add on MD visit for 01/14 per 11/23 POF, mailed updated sch per pt's request... KJ

## 2014-03-13 ENCOUNTER — Other Ambulatory Visit: Payer: Self-pay | Admitting: *Deleted

## 2014-03-13 NOTE — Telephone Encounter (Signed)
THIS REFILL REQUEST FOR REVLIMID WAS PLACED ON DR.SHERRILL'S DESK.

## 2014-03-14 ENCOUNTER — Other Ambulatory Visit: Payer: Self-pay | Admitting: *Deleted

## 2014-03-14 DIAGNOSIS — C9000 Multiple myeloma not having achieved remission: Secondary | ICD-10-CM

## 2014-03-14 MED ORDER — LENALIDOMIDE 10 MG PO CAPS: ORAL_CAPSULE | ORAL | Status: DC

## 2014-03-15 LAB — POCT INR: INR: 3.2

## 2014-03-16 ENCOUNTER — Ambulatory Visit: Payer: Medicare Other | Admitting: Pharmacist

## 2014-03-16 NOTE — Progress Notes (Signed)
INR = 3.2 drawn 03/15/14 at [facility]. I s/w pt over phone (NO CHARGE ENCOUNTER) & she states she is on Coumadin 4 mg/day except 6 mg Tues/Th. She denies bleeding/bruising. She has SOB when moving around, not at rest.  Pt states Dr. Benay Spice is aware. Pt is not hungry but she eats ok.  She drinks a gatorade every day. Her leg wound is "colored" but not swollen. INR is slightly elevated but asymptomatic.  I will continue her current dose without adjustment. Pt is coming 03/22/14 for lab only.  We will call her w/ results & dosing instructions.  If necessary, we can fax orders for next protime at [facility] (fax [phone]). Kennith Center, Pharm.D., CPP 03/16/2014@12 :31 PM

## 2014-03-19 ENCOUNTER — Ambulatory Visit: Payer: Medicare Other | Admitting: Nurse Practitioner

## 2014-03-22 ENCOUNTER — Ambulatory Visit: Payer: Medicare Other | Admitting: Pharmacist

## 2014-03-22 ENCOUNTER — Other Ambulatory Visit (HOSPITAL_BASED_OUTPATIENT_CLINIC_OR_DEPARTMENT_OTHER): Payer: Medicare Other

## 2014-03-22 ENCOUNTER — Telehealth: Payer: Self-pay | Admitting: *Deleted

## 2014-03-22 DIAGNOSIS — I82402 Acute embolism and thrombosis of unspecified deep veins of left lower extremity: Secondary | ICD-10-CM

## 2014-03-22 DIAGNOSIS — C9000 Multiple myeloma not having achieved remission: Secondary | ICD-10-CM

## 2014-03-22 DIAGNOSIS — Z7901 Long term (current) use of anticoagulants: Secondary | ICD-10-CM

## 2014-03-22 LAB — BASIC METABOLIC PANEL (CC13)
Anion Gap: 10 mEq/L (ref 3–11)
BUN: 22.2 mg/dL (ref 7.0–26.0)
CALCIUM: 9.1 mg/dL (ref 8.4–10.4)
CO2: 28 meq/L (ref 22–29)
CREATININE: 0.8 mg/dL (ref 0.6–1.1)
Chloride: 102 mEq/L (ref 98–109)
EGFR: 60 mL/min/{1.73_m2} — ABNORMAL LOW (ref >90)
GLUCOSE: 86 mg/dL (ref 70–140)
Potassium: 3.8 mEq/L (ref 3.5–5.1)
Sodium: 141 mEq/L (ref 136–145)

## 2014-03-22 LAB — CBC WITH DIFFERENTIAL/PLATELET
BASO%: 0.4 % (ref 0.0–2.0)
BASOS ABS: 0 10*3/uL (ref 0.0–0.1)
EOS ABS: 0.1 10*3/uL (ref 0.0–0.5)
EOS%: 5.5 % (ref 0.0–7.0)
HEMATOCRIT: 24 % — AB (ref 34.8–46.6)
HEMOGLOBIN: 7.6 g/dL — AB (ref 11.6–15.9)
LYMPH%: 18.6 % (ref 14.0–49.7)
MCH: 30.8 pg (ref 25.1–34.0)
MCHC: 31.7 g/dL (ref 31.5–36.0)
MCV: 97.2 fL (ref 79.5–101.0)
MONO#: 0.3 10*3/uL (ref 0.1–0.9)
MONO%: 11.9 % (ref 0.0–14.0)
NEUT%: 63.6 % (ref 38.4–76.8)
NEUTROS ABS: 1.5 10*3/uL (ref 1.5–6.5)
PLATELETS: 98 10*3/uL — AB (ref 145–400)
RBC: 2.47 10*6/uL — ABNORMAL LOW (ref 3.70–5.45)
RDW: 19.6 % — ABNORMAL HIGH (ref 11.2–14.5)
WBC: 2.4 10*3/uL — AB (ref 3.9–10.3)
lymph#: 0.4 10*3/uL — ABNORMAL LOW (ref 0.9–3.3)
nRBC: 1 % — ABNORMAL HIGH (ref 0–0)

## 2014-03-22 LAB — PROTIME-INR

## 2014-03-22 LAB — PROTHROMBIN TIME
INR: 4.71 — ABNORMAL HIGH (ref <1.50)
PROTHROMBIN TIME: 44.6 s — AB (ref 11.6–15.2)

## 2014-03-22 LAB — POCT INR: INR: 4.71

## 2014-03-22 NOTE — Telephone Encounter (Signed)
Notified pharmacy that INR was sent out for confirmation. Called and left VM for patient to call office when she returns home to discuss labs and Revlimid. Hgb has declined to 7.6.

## 2014-03-22 NOTE — Telephone Encounter (Signed)
Notified of CBC/INR-told her to hold her Coumadin until she speaks with coumadin clinic for further instructions. She denies any bleeding in stool, urine, gums or nose. Took her last dose of Revlimid today and will start her 1 week break. Admits to feeling more fatigue and dyspnea with exertion, but does not want blood transfusion at this time. Still able to carry out her ADL's and go out. Her son drives her.Not scheduled for follow up till 04/19/14.

## 2014-03-22 NOTE — Patient Instructions (Signed)
INR above goal Hold coumadin x 2 days.  Restart at lower dose of 4 mg daily starting Saturday 12/19.  Recheck on 12/30 by Mliss Sax.   We will call you with results & we will fax orders for next INR check at [facility] to [facility], [place] (fax# [phone]).

## 2014-03-22 NOTE — Progress Notes (Signed)
INR above goal *Telephone Encounter - no charge encounter* Pt is doing well but is feeling tired Appetite has been poor the last week Will adjust dose No missed or extra doses No unusual bleeding or bruising No other changes Plan: Hold coumadin x 2 days.  Restart at lower dose of 4 mg daily starting Saturday 12/19.  Recheck on 12/30 by Mliss Sax.   We will call you with results & we will fax orders for next INR check at [facility] to [facility], [place] (fax# [phone]).

## 2014-03-23 ENCOUNTER — Other Ambulatory Visit: Payer: Self-pay | Admitting: *Deleted

## 2014-03-23 ENCOUNTER — Telehealth: Payer: Self-pay | Admitting: Oncology

## 2014-03-23 ENCOUNTER — Telehealth: Payer: Self-pay | Admitting: *Deleted

## 2014-03-23 DIAGNOSIS — C9000 Multiple myeloma not having achieved remission: Secondary | ICD-10-CM

## 2014-03-23 DIAGNOSIS — Z7901 Long term (current) use of anticoagulants: Secondary | ICD-10-CM

## 2014-03-23 LAB — IGA: IGA: 935 mg/dL — AB (ref 69–380)

## 2014-03-23 LAB — KAPPA/LAMBDA LIGHT CHAINS
KAPPA FREE LGHT CHN: 7.89 mg/dL — AB (ref 0.33–1.94)
Kappa:Lambda Ratio: 5.3 — ABNORMAL HIGH (ref 0.26–1.65)
Lambda Free Lght Chn: 1.49 mg/dL (ref 0.57–2.63)

## 2014-03-23 NOTE — Telephone Encounter (Signed)
lvm for pt son...per pt she wanted me to tell her son about the appts....done...ok and aware

## 2014-03-23 NOTE — Telephone Encounter (Signed)
s.w. pt and advosed on Dec appt...ok and aware

## 2014-03-23 NOTE — Telephone Encounter (Signed)
Message from pt's son requesting CBC to be done at [facility] on 12/21. It is too difficult to bring pt in to office with the construction going on. Spoke with nurse at [facility] They will have lab drawn 03/27/14.  UPDATE: Pt requests to have lab done at [facility] 12/21. Lab appt at [facility] is too early.

## 2014-03-26 ENCOUNTER — Ambulatory Visit (HOSPITAL_BASED_OUTPATIENT_CLINIC_OR_DEPARTMENT_OTHER): Payer: Medicare Other | Admitting: Lab

## 2014-03-26 ENCOUNTER — Other Ambulatory Visit: Payer: Medicare Other

## 2014-03-26 DIAGNOSIS — C9000 Multiple myeloma not having achieved remission: Secondary | ICD-10-CM

## 2014-03-26 DIAGNOSIS — Z7901 Long term (current) use of anticoagulants: Secondary | ICD-10-CM

## 2014-03-26 LAB — CBC WITH DIFFERENTIAL/PLATELET
BASO%: 0.5 % (ref 0.0–2.0)
BASOS ABS: 0 10*3/uL (ref 0.0–0.1)
EOS%: 1.9 % (ref 0.0–7.0)
Eosinophils Absolute: 0.1 10*3/uL (ref 0.0–0.5)
HCT: 24.3 % — ABNORMAL LOW (ref 34.8–46.6)
HGB: 7.8 g/dL — ABNORMAL LOW (ref 11.6–15.9)
LYMPH%: 13.6 % — ABNORMAL LOW (ref 14.0–49.7)
MCH: 31.2 pg (ref 25.1–34.0)
MCHC: 32.1 g/dL (ref 31.5–36.0)
MCV: 97.2 fL (ref 79.5–101.0)
MONO#: 0.4 10*3/uL (ref 0.1–0.9)
MONO%: 10.4 % (ref 0.0–14.0)
NEUT%: 73.6 % (ref 38.4–76.8)
NEUTROS ABS: 2.8 10*3/uL (ref 1.5–6.5)
Platelets: 97 10*3/uL — ABNORMAL LOW (ref 145–400)
RBC: 2.5 10*6/uL — AB (ref 3.70–5.45)
RDW: 19.8 % — AB (ref 11.2–14.5)
WBC: 3.7 10*3/uL — AB (ref 3.9–10.3)
lymph#: 0.5 10*3/uL — ABNORMAL LOW (ref 0.9–3.3)

## 2014-03-26 LAB — PROTIME-INR
INR: 2.5 (ref 2.00–3.50)
Protime: 30 Seconds — ABNORMAL HIGH (ref 10.6–13.4)

## 2014-03-26 LAB — HOLD TUBE, BLOOD BANK

## 2014-03-28 ENCOUNTER — Telehealth: Payer: Self-pay | Admitting: *Deleted

## 2014-03-28 ENCOUNTER — Other Ambulatory Visit: Payer: Self-pay | Admitting: *Deleted

## 2014-03-28 ENCOUNTER — Telehealth: Payer: Self-pay | Admitting: Oncology

## 2014-03-28 ENCOUNTER — Ambulatory Visit (HOSPITAL_COMMUNITY)
Admission: RE | Admit: 2014-03-28 | Discharge: 2014-03-28 | Disposition: A | Discharge status: Home / Self Care | Payer: Medicare Other | Source: Ambulatory Visit | Attending: Oncology | Admitting: Oncology

## 2014-03-28 ENCOUNTER — Other Ambulatory Visit: Payer: Medicare Other

## 2014-03-28 ENCOUNTER — Ambulatory Visit: Payer: Medicare Other

## 2014-03-28 DIAGNOSIS — C9000 Multiple myeloma not having achieved remission: Secondary | ICD-10-CM | POA: Insufficient documentation

## 2014-03-28 DIAGNOSIS — C569 Malignant neoplasm of unspecified ovary: Secondary | ICD-10-CM | POA: Diagnosis not present

## 2014-03-28 DIAGNOSIS — R7611 Nonspecific reaction to tuberculin skin test without active tuberculosis: Secondary | ICD-10-CM | POA: Insufficient documentation

## 2014-03-28 LAB — PREPARE RBC (CROSSMATCH)

## 2014-03-28 NOTE — Telephone Encounter (Signed)
-----   Message from Ladell Pier, MD sent at 03/28/2014  3:36 PM EST ----- Please call patient, cxr ok, rt. Effusion is small

## 2014-03-28 NOTE — Telephone Encounter (Signed)
Left message at home number. Called pt's son with CXRay results. Also instructed him to have her keep the blood bank bracelet on for transfusion tomorrow.

## 2014-03-28 NOTE — Telephone Encounter (Signed)
Per 12/23 POF labs added to blood transfusion for 12/24...Marland KitchenMarland Kitchen

## 2014-03-28 NOTE — Telephone Encounter (Signed)
Pt is scheduled for transfusion 12/24 at 0800. Requested son bring her in for crossmatch and Chest XRay today. He will discuss with pt and call office for lab appt.

## 2014-03-28 NOTE — Telephone Encounter (Signed)
Pt's son called Dr. Benay Spice to request blood transfusion. Unable to work in for today. Orders entered for 2 units 12/24.

## 2014-03-29 ENCOUNTER — Other Ambulatory Visit: Payer: Medicare Other

## 2014-03-29 ENCOUNTER — Ambulatory Visit (HOSPITAL_BASED_OUTPATIENT_CLINIC_OR_DEPARTMENT_OTHER): Payer: Medicare Other

## 2014-03-29 VITALS — BP 168/68 | HR 81 | Temp 98.5°F | Resp 24

## 2014-03-29 DIAGNOSIS — C9000 Multiple myeloma not having achieved remission: Secondary | ICD-10-CM | POA: Diagnosis not present

## 2014-03-29 MED ORDER — FUROSEMIDE 10 MG/ML IJ SOLN
20.0000 mg | Freq: Once | INTRAMUSCULAR | Status: AC
  Administered 2014-03-29: 20 mg via INTRAVENOUS

## 2014-03-29 MED ORDER — FUROSEMIDE 10 MG/ML IJ SOLN
20.0000 mg | Freq: Once | INTRAMUSCULAR | Status: DC
Start: 2014-03-29 — End: 2014-03-29

## 2014-03-29 MED ORDER — SODIUM CHLORIDE 0.9 % IV SOLN
250.0000 mL | Freq: Once | INTRAVENOUS | Status: AC
  Administered 2014-03-29: 250 mL via INTRAVENOUS

## 2014-03-29 NOTE — Patient Instructions (Signed)
Blood Transfusion Information--Your blood type is A+ WHAT IS A BLOOD TRANSFUSION? A transfusion is the replacement of blood or some of its parts. Blood is made up of multiple cells which provide different functions.  Red blood cells carry oxygen and are used for blood loss replacement.  White blood cells fight against infection.  Platelets control bleeding.  Plasma helps clot blood.  Other blood products are available for specialized needs, such as hemophilia or other clotting disorders. BEFORE THE TRANSFUSION  Who gives blood for transfusions?   You may be able to donate blood to be used at a later date on yourself (autologous donation).  Relatives can be asked to donate blood. This is generally not any safer than if you have received blood from a stranger. The same precautions are taken to ensure safety when a relative's blood is donated.  Healthy volunteers who are fully evaluated to make sure their blood is safe. This is blood bank blood. Transfusion therapy is the safest it has ever been in the practice of medicine. Before blood is taken from a donor, a complete history is taken to make sure that person has no history of diseases nor engages in risky social behavior (examples are intravenous drug use or sexual activity with multiple partners). The donor's travel history is screened to minimize risk of transmitting infections, such as malaria. The donated blood is tested for signs of infectious diseases, such as HIV and hepatitis. The blood is then tested to be sure it is compatible with you in order to minimize the chance of a transfusion reaction. If you or a relative donates blood, this is often done in anticipation of surgery and is not appropriate for emergency situations. It takes many days to process the donated blood. RISKS AND COMPLICATIONS Although transfusion therapy is very safe and saves many lives, the main dangers of transfusion include:   Getting an infectious  disease.  Developing a transfusion reaction. This is an allergic reaction to something in the blood you were given. Every precaution is taken to prevent this. The decision to have a blood transfusion has been considered carefully by your caregiver before blood is given. Blood is not given unless the benefits outweigh the risks. AFTER THE TRANSFUSION  Right after receiving a blood transfusion, you will usually feel much better and more energetic. This is especially true if your red blood cells have gotten low (anemic). The transfusion raises the level of the red blood cells which carry oxygen, and this usually causes an energy increase.  The nurse administering the transfusion will monitor you carefully for complications. HOME CARE INSTRUCTIONS  No special instructions are needed after a transfusion. You may find your energy is better. Speak with your caregiver about any limitations on activity for underlying diseases you may have. SEEK MEDICAL CARE IF:   Your condition is not improving after your transfusion.  You develop redness or irritation at the intravenous (IV) site. SEEK IMMEDIATE MEDICAL CARE IF:  Any of the following symptoms occur over the next 12 hours:  Shaking chills.  You have a temperature by mouth above 102 F (38.9 C), not controlled by medicine.  Chest, back, or muscle pain.  People around you feel you are not acting correctly or are confused.  Shortness of breath or difficulty breathing.  Dizziness and fainting.  You get a rash or develop hives.  You have a decrease in urine output.  Your urine turns a dark color or changes to pink, red, or brown.  Any of the following symptoms occur over the next 10 days:  You have a temperature by mouth above 102 F (38.9 C), not controlled by medicine.  Shortness of breath.  Weakness after normal activity.  The white part of the eye turns yellow (jaundice).  You have a decrease in the amount of urine or are  urinating less often.  Your urine turns a dark color or changes to pink, red, or brown. Document Released: 03/20/2000 Document Revised: 06/15/2011 Document Reviewed: 11/07/2007 [facility] Patient Information 2015 [place], [place]. This information is not intended to replace advice given to you by your health care provider. Make sure you discuss any questions you have with your health care provider.

## 2014-03-29 NOTE — Progress Notes (Signed)
Spoke with MD- OK to wait till 04/19/14 for repeat labs. Made aware of oxygen sat at 88-89% on room air. Placed patient on oxygen at 1 l/min [place]> @1130  Made MD aware that her oxygen sat on room air remains low and she reports the walk to toilet made her short of breath. Noted crackles in RLL lung. Order received to give Lasix 20 mg IV @1245  Patient reports last trip to restroom was not as tiring and oxygen sat was 92 % on room air.

## 2014-03-30 LAB — TYPE AND SCREEN
ABO/RH(D): A POS
Antibody Screen: NEGATIVE
Unit division: 0
Unit division: 0

## 2014-04-04 ENCOUNTER — Telehealth: Payer: Self-pay | Admitting: Pharmacist

## 2014-04-04 NOTE — Telephone Encounter (Signed)
Mliss Sax, RN at [facility] [employer] called to inform us that pt declined having her INR drawn today.  She did not want to come out in the rain.  She will attempt tomorrow & call in + fax results if able. Kennith Center, Pharm.D., CPP 04/04/2014@2 :30 PM

## 2014-04-05 ENCOUNTER — Ambulatory Visit: Payer: Medicare Other

## 2014-04-05 LAB — POCT INR: INR: 2.5

## 2014-04-05 NOTE — Progress Notes (Signed)
*  Telephone encounter - no charge*  Ms. Heidi Pratt's INR results were faxed to [place] today from [place]. Her INR is 2.5 which is within her goal range of 2-3. Though our previously documented plan was for her to take 4 mg daily, Ms. Heidi Pratt reports the dose she has been taking as 4 mg daily except 6 mg Tuesdays and Thursdays. She reports no extra and/or missed doses, no medication and/or dietary changes, as well as no bleeding and/or unusual bruising. She has no issues regarding her anticoagulation therapy at this time.  Plan: Continue patient's self-reported dosing: 4 mg daily and 6 mg on Tuesdays and Thursdays Recheck INR at [facility] on 04/17/14. We will fax the order for the INR check today to [facility] [phone])  with instructions for them to call us and/or fax [place] the INR results.

## 2014-04-10 ENCOUNTER — Other Ambulatory Visit: Payer: Self-pay | Admitting: *Deleted

## 2014-04-10 NOTE — Telephone Encounter (Signed)
Refill request for Revlimid 10 mg to MD desk for signature.

## 2014-04-12 ENCOUNTER — Other Ambulatory Visit: Payer: Self-pay | Admitting: *Deleted

## 2014-04-12 DIAGNOSIS — C9000 Multiple myeloma not having achieved remission: Secondary | ICD-10-CM

## 2014-04-12 MED ORDER — LENALIDOMIDE 10 MG PO CAPS: ORAL_CAPSULE | ORAL | Status: DC

## 2014-04-19 ENCOUNTER — Ambulatory Visit (HOSPITAL_BASED_OUTPATIENT_CLINIC_OR_DEPARTMENT_OTHER): Payer: Medicare Other | Admitting: Nurse Practitioner

## 2014-04-19 ENCOUNTER — Other Ambulatory Visit (HOSPITAL_BASED_OUTPATIENT_CLINIC_OR_DEPARTMENT_OTHER): Payer: Medicare Other

## 2014-04-19 ENCOUNTER — Telehealth: Payer: Self-pay | Admitting: Oncology

## 2014-04-19 ENCOUNTER — Encounter: Payer: Self-pay | Admitting: Nurse Practitioner

## 2014-04-19 ENCOUNTER — Ambulatory Visit (HOSPITAL_BASED_OUTPATIENT_CLINIC_OR_DEPARTMENT_OTHER): Payer: Medicare Other | Admitting: Pharmacist

## 2014-04-19 VITALS — BP 135/59 | HR 66 | Temp 98.0°F | Resp 18 | Ht 63.0 in | Wt 115.1 lb

## 2014-04-19 DIAGNOSIS — I82402 Acute embolism and thrombosis of unspecified deep veins of left lower extremity: Secondary | ICD-10-CM | POA: Diagnosis not present

## 2014-04-19 DIAGNOSIS — D630 Anemia in neoplastic disease: Secondary | ICD-10-CM | POA: Diagnosis not present

## 2014-04-19 DIAGNOSIS — C9000 Multiple myeloma not having achieved remission: Secondary | ICD-10-CM

## 2014-04-19 DIAGNOSIS — D702 Other drug-induced agranulocytosis: Secondary | ICD-10-CM | POA: Diagnosis not present

## 2014-04-19 DIAGNOSIS — Z7901 Long term (current) use of anticoagulants: Secondary | ICD-10-CM

## 2014-04-19 LAB — CBC WITH DIFFERENTIAL/PLATELET
BASO%: 0.5 % (ref 0.0–2.0)
Basophils Absolute: 0 10*3/uL (ref 0.0–0.1)
EOS%: 6.2 % (ref 0.0–7.0)
Eosinophils Absolute: 0.1 10*3/uL (ref 0.0–0.5)
HCT: 31.3 % — ABNORMAL LOW (ref 34.8–46.6)
HGB: 9.9 g/dL — ABNORMAL LOW (ref 11.6–15.9)
LYMPH#: 0.3 10*3/uL — AB (ref 0.9–3.3)
LYMPH%: 14.5 % (ref 14.0–49.7)
MCH: 30.1 pg (ref 25.1–34.0)
MCHC: 31.6 g/dL (ref 31.5–36.0)
MCV: 95.1 fL (ref 79.5–101.0)
MONO#: 0.2 10*3/uL (ref 0.1–0.9)
MONO%: 10.9 % (ref 0.0–14.0)
NEUT#: 1.3 10*3/uL — ABNORMAL LOW (ref 1.5–6.5)
NEUT%: 67.9 % (ref 38.4–76.8)
NRBC: 1 % — AB (ref 0–0)
Platelets: 81 10*3/uL — ABNORMAL LOW (ref 145–400)
RBC: 3.29 10*6/uL — ABNORMAL LOW (ref 3.70–5.45)
RDW: 19.5 % — AB (ref 11.2–14.5)
WBC: 1.9 10*3/uL — ABNORMAL LOW (ref 3.9–10.3)

## 2014-04-19 LAB — BASIC METABOLIC PANEL (CC13)
ANION GAP: 9 meq/L (ref 3–11)
BUN: 22.9 mg/dL (ref 7.0–26.0)
CHLORIDE: 99 meq/L (ref 98–109)
CO2: 32 mEq/L — ABNORMAL HIGH (ref 22–29)
CREATININE: 0.8 mg/dL (ref 0.6–1.1)
Calcium: 9.2 mg/dL (ref 8.4–10.4)
EGFR: 60 mL/min/{1.73_m2} — AB (ref >90)
Glucose: 89 mg/dl (ref 70–140)
Potassium: 3.5 mEq/L (ref 3.5–5.1)
SODIUM: 140 meq/L (ref 136–145)

## 2014-04-19 LAB — PROTIME-INR
INR: 3.9 — AB (ref 2.00–3.50)
Protime: 46.8 Seconds — ABNORMAL HIGH (ref 10.6–13.4)

## 2014-04-19 LAB — POCT INR: INR: 3.9

## 2014-04-19 NOTE — Telephone Encounter (Signed)
gv and printed appt sched and avs for pt for Feb  °

## 2014-04-19 NOTE — Progress Notes (Addendum)
[facility] OFFICE PROGRESS NOTE   Diagnosis:  Multiple myeloma  INTERVAL HISTORY:   Ms. Afshar returns as scheduled. She continues Revlimid 21 days on/7 days off. She denies nausea/vomiting. No neuropathy symptoms. She denies bleeding. She was transfused 2 units of blood on 03/29/2014. She noted a slight improvement in her energy level following the transfusion. She had some urinary incontinence after the transfusion which she attributes to receiving Lasix. Appetite is poor. She has lost some weight since her last visit. She intermittently has rectal pain when straining for a bowel movement.  Objective:  Vital signs in last 24 hours:  Blood pressure 135/59, pulse 66, temperature 98 F (36.7 C), temperature source Oral, resp. rate 18, height 5\' 3"  (1.6 m), weight 115 lb 1.6 oz (52.209 kg), SpO2 91 %.    HEENT: Mouth is dry appearing. No thrush or ulcers. Resp: Distant breath sounds. Rhonchi at the upper chest bilaterally. No respiratory distress. Cardio: Regular rate and rhythm. GI: Abdomen soft and nontender. No organomegaly. Soft external hemorrhoid corresponding to area where she experiences pain with a bowel movement. The hemorrhoid does not appear thrombosed. Vascular: Pitting edema at the lower legs bilaterally right greater than left.  Skin: Healed ulcer left pretibial region. Chronic discoloration left lower leg.   Lab Results:  Lab Results  Component Value Date   WBC 1.9* 04/19/2014   HGB 9.9* 04/19/2014   HCT 31.3* 04/19/2014   MCV 95.1 04/19/2014   PLT 81* 04/19/2014   NEUTROABS 1.3* 04/19/2014    Imaging:  No results found.  Medications: I have reviewed the patient's current medications.  Assessment/Plan: 1. igA kappa multiple myeloma diagnosed in November 2012, initially treated with melphalan/prednisone through April of 2013 without a response   Initiation of single agent Revlimid July 2014, cycle on a 3 week on/one-week off schedule    Revlimid placed on hold July 2015 secondary to neutropenia and a left leg cellulitis   Revlimid resumed 12/05/2013 2. Right pleural effusion, numerous plasmacytoid cells on cytology June 2014  3. Stage I ovarian cancer treated with a unilateral oophorectomy in may 2009  4. History of C. difficile colitis  5. Degenerative arthritis  6. Hypothyroidism  7. Hypertension  8. Pneumonia October 2010  9. 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 Left leg cellulitis June 2015- the left leg wound has healed 12. Anemia secondary to multiple myeloma. Transfused 2 units of blood 03/29/2014 for hemoglobin 7.8. 13. Neutropenia secondary to Revlimid   Disposition: Ms. Menton will complete the current cycle of Revlimid. Her son would like to give her a break off Revlimid if the outstanding myeloma labs from today are stable. We will contact him once the results are available.  Ms. Rivers and her son understand the anemia may indicate progression of the myeloma. We will continue to monitor closely. Dr. Aldean Ast initiated discussion regarding other treatment options (Velcade) if the myeloma progresses on Revlimid.  She will return for a follow-up visit in one month with labs 1 week prior.  [facility]  Patient seen with Dr. Truett Perna.  Truett Perna ANP/GNP-BC   04/19/2014  12:58 PM  This was a shared visit with 04/21/2014,. I discussed treatment options with Ms. Bills and her son.  The recent anemia requiring transfusion support may indicate disease progression. She will complete the current cycle of Revlimid. The plan is to  place Revlimid on hold if the IgA level and serum free light chains are stable today.  Julieanne Manson, M.D.

## 2014-04-19 NOTE — Patient Instructions (Signed)
Hold dose today then continue 4 mg daily.   Recheck on 1/27 by Mliss Sax.  We will call you with the results & we will fax orders for next INR check at [facility] to [facility], [place] (fax# [phone])

## 2014-04-19 NOTE — Progress Notes (Signed)
Pt seen with son today as she was also seen by Ned Card INR=3.9 (goal 2-3) H/H now holding after blodd last week PLT still low INR supratherapeutic No med or diet changes to report Decided in office visit to hold Revlimid for a month per son  Hold dose today then continue 4 mg daily.   Recheck on 1/27 by Mliss Sax (faxed orders) We will call you with the results & we will fax orders for next INR check at [facility] to [facility] [place], [place] (fax# [phone])  We agree on the plan

## 2014-04-20 LAB — KAPPA/LAMBDA LIGHT CHAINS
KAPPA FREE LGHT CHN: 7.47 mg/dL — AB (ref 0.33–1.94)
Kappa:Lambda Ratio: 5.08 — ABNORMAL HIGH (ref 0.26–1.65)
LAMBDA FREE LGHT CHN: 1.47 mg/dL (ref 0.57–2.63)

## 2014-04-20 LAB — IGA: IGA: 1010 mg/dL — AB (ref 69–380)

## 2014-04-23 ENCOUNTER — Telehealth: Payer: Self-pay | Admitting: *Deleted

## 2014-04-23 NOTE — Telephone Encounter (Signed)
Called patient to arrange delivery of Revlimid and she told pharmacy that it was on hold. Need to confirm.

## 2014-04-24 ENCOUNTER — Telehealth: Payer: Self-pay | Admitting: *Deleted

## 2014-04-24 NOTE — Telephone Encounter (Signed)
[facility] called for clarification of Revlimid order.  Is this on hold as patient reports or do we need proceed with refilling this order?  Will notify provider.  [facility] can be reached at [phone] opt. 2.

## 2014-04-24 NOTE — Telephone Encounter (Signed)
Verbal order received and read back from Dr. Benay Spice that Revlimid is on hold.  Called [facility], spoke with Joaquim Lai.  Informed her to place Revlimid on hold until further notice,

## 2014-04-25 ENCOUNTER — Non-Acute Institutional Stay (SKILLED_NURSING_FACILITY): Payer: Medicare Other | Admitting: Internal Medicine

## 2014-04-25 ENCOUNTER — Encounter: Payer: Self-pay | Admitting: Internal Medicine

## 2014-04-25 DIAGNOSIS — J181 Lobar pneumonia, unspecified organism: Secondary | ICD-10-CM

## 2014-04-25 DIAGNOSIS — Z7901 Long term (current) use of anticoagulants: Secondary | ICD-10-CM

## 2014-04-25 DIAGNOSIS — E039 Hypothyroidism, unspecified: Secondary | ICD-10-CM

## 2014-04-25 DIAGNOSIS — R627 Adult failure to thrive: Secondary | ICD-10-CM

## 2014-04-25 DIAGNOSIS — J189 Pneumonia, unspecified organism: Secondary | ICD-10-CM

## 2014-04-25 DIAGNOSIS — R5381 Other malaise: Secondary | ICD-10-CM

## 2014-04-25 DIAGNOSIS — C9000 Multiple myeloma not having achieved remission: Secondary | ICD-10-CM

## 2014-04-25 LAB — POCT INR: INR: 6.9 — AB (ref 0.9–1.1)

## 2014-04-25 MED ORDER — MOXIFLOXACIN HCL 400 MG PO TABS: 400.0000 mg | ORAL_TABLET | Freq: Every day | ORAL | Status: AC

## 2014-04-25 MED ORDER — WARFARIN SODIUM 4 MG PO TABS: 4.0000 mg | ORAL_TABLET | Freq: Every day | ORAL | Status: DC

## 2014-04-25 NOTE — Progress Notes (Signed)
Patient ID: Heidi Pratt, female   DOB: [birth year], 81 y.o.   MRN: [id number]  Provider:  Rexene Edison. Mariea Clonts, D.O., C.M.D.  Location:  [facility]  PCP: Irven Shelling, MD  Code Status: full code  Allergies  Allergen Reactions  . Amoxicillin Diarrhea  . Crab [Shellfish Allergy] Nausea And Vomiting    Chief Complaint  Patient presents with  . New Admit To SNF    new admission to rehab for treatment of pneumonia and strengthening    HPI: 81 y.o. female with h/o ovarian cancer in the past s/p oophorectomy, htn, hypothyroidism was admitted here for rehab due to deconditioning, weakness and failure to thrive in the context of hypoxia, tachypnea and chest xray suspected for left-sided pneumonia. She had a fall and nursing found her in her home prior to transfer to [place] 1[place].  She then had the hypoxia, tachnpnea and positive xray.  She has multiple myeloma and had been taking revlimid until a recent discussion her family had with Dr. Learta Codding where they decided to discontinue this.  She previously had plasma cells in a pleural effusion and a suspicious area on that same left side of her lung.  (see imaging details below).    When I saw her today, she admitted to poor appetite, weakness and ADL dependence.  She says that "they don't know what's wrong with me." Her son says she was aware of the initial xray finding last year, but has not been told that it could be cancer again this time.    ROS: Review of Systems  Constitutional: Positive for weight loss and malaise/fatigue. Negative for fever and chills.  Respiratory: Positive for cough and shortness of breath.   Cardiovascular: Negative for chest pain and leg swelling.  Gastrointestinal: Negative for abdominal pain, constipation, blood in stool and melena.  Genitourinary: Negative for dysuria.  Musculoskeletal: Negative for myalgias and falls.  Skin: Negative for rash.  Neurological: Positive for weakness. Negative for dizziness  and headaches.  Psychiatric/Behavioral: Positive for memory loss. Negative for depression.     Past Medical History  Diagnosis Date  . Ovarian cancer 07/2007  . Hypertension   . Hypothyroidism   . 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 C. difficile colitis 02/23/2011  . Pneumonia 02/23/2011  . Hypothyroid 06/02/2011  . DJD (degenerative joint disease) 06/02/2011  . Benign essential HTN 06/02/2011  . History of positive PPD 06/02/2011  . Calcified granuloma of lung 06/02/2011  . Pleural effusion, right 09/15/2012    Thoracentesis 09/05/12: plasma cells on cytology CD138 positive  . Anemia   . Calf swelling 10/24/2012    LLE  Week 3 cycle 1 revlimid  . DVT (deep venous thrombosis) 2014    re; Revlimid. Chronic anticoagulation   Past Surgical History  Procedure Laterality Date  . Laparoscopic hysterectomy  age 35  . Laparoscopic salpingoopherectomy  07/2007    left  . Hernia repair  08/2007  . Appendectomy    . Replacement total knee  2008    LEFT   Social History:   reports that she has quit smoking. She does not have any smokeless tobacco history on file. She reports that she drinks about 2.5 oz of alcohol per week. Her drug history is not on file.  Family History  Problem Relation Age of Onset  . Heart disease Father     Medications: Patient's Medications  New Prescriptions   No medications on file  Previous Medications   AMLODIPINE (NORVASC) 2.5 MG TABLET  Take 2.5 mg by mouth daily.   CALCIUM-VITAMIN D (OSCAL WITH D) 250-125 MG-UNIT PER TABLET    Take 1 tablet by mouth daily.    COLLAGENASE (SANTYL) OINTMENT    Apply 1 application topically daily.   FUROSEMIDE (LASIX) 40 MG TABLET    Take 40 mg by mouth daily.   GLUCOSAMINE-CHONDROITIN 500-400 MG TABLET    Take 1 tablet by mouth daily.    IRON POLYSACCHARIDES (NIFEREX) 150 MG CAPSULE    Take 1 capsule (150 mg total) by mouth 2 (two) times daily. $RemoveBefo'325mg'JTwXgBgemVR$  daily   LENALIDOMIDE (REVLIMID) 10 MG CAPSULE    TAKE ONE CAPSULE  DAILY FOR 21 DAYS FOLLOWED BY 7 DAYS REST.   LEVOTHYROXINE (SYNTHROID, LEVOTHROID) 88 MCG TABLET    Take 88 mcg by mouth daily before breakfast.   MULTIPLE VITAMINS-MINERALS (CENTRUM SILVER PO)    Take by mouth daily.     NEBIVOLOL (BYSTOLIC) 10 MG TABLET    Take 10 mg by mouth daily.     PSYLLIUM (METAMUCIL) 58.6 % POWDER    Take 1 packet by mouth daily as needed.   VALSARTAN (DIOVAN PO)    Take 320 mg by mouth daily.    WARFARIN (COUMADIN) 4 MG TABLET    Take $Rem'4mg'FFwb$  daily except $RemoveBefo'6mg'yrCuHAgsVdC$  on Tu&Thu. She has $Remov'4mg'ryNicr$  and $Re'6mg'IxN$  tablets.   WARFARIN (COUMADIN) 6 MG TABLET    Take $Rem'4mg'gNik$  daily except $RemoveBefo'6mg'XMqIyysefat$  on Tu&Thu. Pt has $Remo'4mg'Hxxff$  and $Re'6mg'qSU$  tablets.  Modified Medications   No medications on file  Discontinued Medications   No medications on file     Physical Exam: Filed Vitals:   04/25/14 1906  BP: 148/74  Pulse: 74  Temp: 98.6 F (37 C)  Resp: 20  Height: 5' (1.524 m)  Weight: 125 lb (56.7 kg)  SpO2: 95%  Physical Exam  Constitutional:  Frail white female  HENT:  Head: Normocephalic and atraumatic.  Right Ear: External ear normal.  Left Ear: External ear normal.  Nose: Nose normal.  Mouth/Throat: Oropharynx is clear and moist.  Eyes: Conjunctivae and EOM are normal. Pupils are equal, round, and reactive to light.  Neck: Normal range of motion. Neck supple. No JVD present.  Cardiovascular: Normal rate, regular rhythm, normal heart sounds and intact distal pulses.   Pulmonary/Chest: She has wheezes.  Coarse rhonchi; wearing oxygen  Abdominal: Soft. Bowel sounds are normal. She exhibits distension. She exhibits no mass. There is no tenderness.  Musculoskeletal: Normal range of motion. She exhibits edema. She exhibits no tenderness.  1+ edema bilateral LEs  Neurological: She is alert.  Skin: Skin is warm and dry.  Psychiatric: She has a normal mood and affect. Her behavior is normal. Judgment and thought content normal.     Labs reviewed: Basic Metabolic Panel:  Recent Labs  02/22/14 1110  03/22/14 1159 04/19/14 1135  NA 140 141 140  K 3.9 3.8 3.5  CO2 29 28 32*  GLUCOSE 104 86 89  BUN 31.7* 22.2 22.9  CREATININE 1.1 0.8 0.8  CALCIUM 9.6 9.1 9.2   Liver Function Tests:  Recent Labs  07/03/13 1105 07/31/13 1104 09/25/13 1125  AST 32 25 20  ALT 45 21 25  ALKPHOS 91 79 91  BILITOT 0.29 0.35 0.38  PROT 6.2* 6.3* 6.5  ALBUMIN 3.3* 3.2* 3.3*  CBC:  Recent Labs  03/22/14 1158 03/26/14 1115 04/19/14 1135  WBC 2.4* 3.7* 1.9*  NEUTROABS 1.5 2.8 1.3*  HGB 7.6* 7.8* 9.9*  HCT 24.0* 24.3* 31.3*  MCV  97.2 97.2 95.1  PLT 98* 97* 81*  Imaging and Procedures: 08/26/12:  CT cervical spine w/o contrast:  Chronic type 2 odontoid fracture appears similar to 2011.New fractures involving the right pedicle of C2, as well as the left lamina and lateral mass of C2 (see images 15 - 18, series 3). These fractures could represent an unstable injury, a hangman's type variant, and neurosurgical consultation is warranted. These were not present in 2011. Stable multilevel spondylosis C3-C7. Moderate sized right pleural effusion, etiology unknown. Consider two-view chest for further evaluation.  08/26/12:  Ct head w/o contrast:  Moderate small vessel ischemic change and atrophy. No acute intracranial abnormality  09/05/12:  CXR:  1. Negative for pneumothorax after thoracentesis. 2. Cardiomegaly.  09/05/12:  Thoracentesis:  Successful ultrasound guided diagnostic and therapeutic right thoracentesis yielding 630 c's of pleural fluid.  10/11/12:  Bone Survey Met:  No definite focal lytic lesions identified to suggest multiple Myeloma. Osseous demineralization with degenerative disc and facet disease changes of the spine and scattered degenerative changes of the joints. Sclerosis at old right fifth rib fracture. Question small right pleural effusion. Old odontoid fracture.  10/18/12:  CXR:  Small right pleural effusion with mild atelectasis. Decubitus xray:  Mobile right pleural  effusion layering up to 19 mm.  Thoracentesis 09/05/12: plasma cells on cytology CD138 positive  12/01/12:  Ct cervical spine w/o contrast:  Bilateral C2 fractures involve the right pedicle and left lamina/facet have not healed. Ununited type 2 odontoid fracture appears unchanged from 2006. Moderate pannus remains.  Difficult to appreciate in retrospect, but probably present, are bilateral posterior arch fractures of C1. These remain ununited as do the C2 pedicle and laminar arch fractures.  03/06/13:  Bone survey met:  No lytic or sclerotic osseous lesion of the axial or appendicular skeleton.  03/06/13:  CXR:  No active cardiopulmonary disease  08/10/13:  CXR:  No pulmonary edema. Streaky right perihilar increased bronchial markings suspicious for bronchitic changes or early infiltrate.  02/26/14:  CXR:  Nodular consolidation in right suprahilar region highly suspicious for mass or adenopathy. Less likely focal pneumonia. Further evaluation with CT scan of the chest is recommended.  03/28/14:  CXR:  1. Mild interval increase in the size of the right pleural effusion with new small left pleural effusion. There is new subsegmental atelectasis in the left mid and lower lung. 2. There is stable subtle soft tissue density in the right suprahilar region which may reflect a pulmonary parenchymal mass or lymphadenopathy. Chest CT scanning is recommended.  CXR 04/23/14:  Left sided pneumonia.    Assessment/Plan 1. Multiple myeloma without remission -upon discussion with patient's son and review of her records, it appears that she has had a pleural effusion with plasmocytes in the past and an area in her left lung that is suspicious for malignancy -family had a discussion with Dr. Learta Codding (oncology) and decided to hold her chemo for a couple of months and monitor - warfarin (COUMADIN) 4 MG tablet; Take 1 tablet (4 mg total) by mouth daily at 6 PM.  Dispense: 30 tablet; Refill: 3--was on this after dvt when  on revlimid--continued at this time due to high chance of DVT with malignancy -coumadin on hold due to INR 6.49 today (was held yesterday)  2. Left lower lobe pneumonia - was moved here from [place] due to hypoxia at 86% and RR 30, poor intake and decline in functional ability -unclear if she truly has pneumonia or if this is recurrence/worsening of malignancy  in the left lung (since revlimid was stopped) -her son is clear that they do not want more advanced imaging to assess the area (like CT chest)--want to avoid worsening her renal function - moxifloxacin (AVELOX) 400 MG tablet; Take 1 tablet (400 mg total) by mouth daily.  Dispense: 7 tablet; Refill: 0  3. Hypothyroidism, unspecified hypothyroidism type -cont synthroid 89mcg daily  4. Long term current use of anticoagulant therapy -hold coumadin and recheck INR on 04/27/14 (note pt was on $Remo'4mg'bKqNC$  daily recently and is now on avelox which will affect her INR)  5. Physical deconditioning -is here in rehab for pneumonia treatment, PT, OT  6.  Failure to thrive in adult -had been losing weight--suspect weights are not accurate as they show an upward trend in the past 10 days (unless this is b/c she is having more assistance in [place] helping her eat)  Functional status:  Dependent in adls, unable to perform any self care and even having difficulty feeding herself  Family/ staff Communication: discussed with pt's son and her nurse  Labs/tests ordered:  F/u cbc with INR on Friday 04/27/14

## 2014-04-26 ENCOUNTER — Ambulatory Visit: Payer: Medicare Other | Admitting: Oncology

## 2014-04-26 ENCOUNTER — Non-Acute Institutional Stay (SKILLED_NURSING_FACILITY): Payer: Medicare Other | Admitting: Adult Health

## 2014-04-26 DIAGNOSIS — E871 Hypo-osmolality and hyponatremia: Secondary | ICD-10-CM

## 2014-04-26 DIAGNOSIS — R4781 Slurred speech: Secondary | ICD-10-CM

## 2014-04-26 DIAGNOSIS — Z7901 Long term (current) use of anticoagulants: Secondary | ICD-10-CM

## 2014-04-29 LAB — CBC AND DIFFERENTIAL
HEMATOCRIT: 29 % — AB (ref 36–46)
HEMOGLOBIN: 9.5 g/dL — AB (ref 12.0–16.0)
Platelets: 81 10*3/uL — AB (ref 150–399)
WBC: 2.8 10*3/mL

## 2014-04-29 LAB — BASIC METABOLIC PANEL
BUN: 24 mg/dL — AB (ref 4–21)
CREATININE: 0.8 mg/dL (ref 0.5–1.1)
Glucose: 87 mg/dL
Sodium: 143 mmol/L (ref 137–147)

## 2014-04-29 LAB — POCT INR: INR: 1.7 — AB (ref <=1.1)

## 2014-04-30 ENCOUNTER — Encounter: Payer: Self-pay | Admitting: Adult Health

## 2014-04-30 ENCOUNTER — Non-Acute Institutional Stay (SKILLED_NURSING_FACILITY): Payer: Medicare Other | Admitting: Adult Health

## 2014-04-30 DIAGNOSIS — J181 Lobar pneumonia, unspecified organism: Secondary | ICD-10-CM

## 2014-04-30 DIAGNOSIS — R609 Edema, unspecified: Secondary | ICD-10-CM

## 2014-04-30 DIAGNOSIS — Z7901 Long term (current) use of anticoagulants: Secondary | ICD-10-CM

## 2014-04-30 DIAGNOSIS — I10 Essential (primary) hypertension: Secondary | ICD-10-CM

## 2014-04-30 DIAGNOSIS — R4781 Slurred speech: Secondary | ICD-10-CM | POA: Insufficient documentation

## 2014-04-30 DIAGNOSIS — J189 Pneumonia, unspecified organism: Secondary | ICD-10-CM

## 2014-04-30 NOTE — Assessment & Plan Note (Signed)
Noted to hands and right lower ext. No recent echo on file. Last Na 143, K 4.8. Give 1 dose of Lasix 40mg  and attempt to wean off oxygen.

## 2014-04-30 NOTE — Assessment & Plan Note (Signed)
Currently on coumadin 4mg , recheck INR

## 2014-04-30 NOTE — Assessment & Plan Note (Signed)
Completed Avelox. Try to wean 02 for sat >= 92%

## 2014-04-30 NOTE — Assessment & Plan Note (Signed)
D/C norvasc due to peripheral edema. Monitor BP.

## 2014-04-30 NOTE — Assessment & Plan Note (Signed)
This issue has resolved. ?TIA vs. Dry mouth? I spoke with her son, Dr. Serita Butcher, and he stated that this symptom has been misconstrued in the past due to her severe dry mouth. I mentioned that she referenced not being able to get her words out but that the issue had resolved. He is not interested at this time in more aggressive work up given her debilitated state. I have ordered to check her INR and we agreed to maintain her in therapeutic range until she is seen by Dr. Benay Spice with oncology. At that time, she will be evaluated for the continued need for coumadin.

## 2014-04-30 NOTE — Progress Notes (Signed)
Patient ID: Heidi Pratt, female   DOB: 04-26-1918, 81 y.o.   MRN: [id number]     Nursing Home Location:  [facility]  rehab section  Code Status: Full code   Chief Complaint  Patient presents with  . Acute Visit    HPI:  81 y.o. female  residing at [facility], rehab section. I am here to f/u after completion of Avelox for right perihilar pna.  She has a hx of multiple myeloma, DVT, hyponatremia, and a questionable area in the right lung for cancer. She reports that she never had sputum production or fever or cough. She is still on oxygen and reports some DOE with activity but not at rest. She currently is on Coumadin $RemoveBef'4mg'lLvjOjpALf$  for a hx of DVT/chemo admin.   She is weak and just started working with PT today. She has edema in her hands and legs. She has a hx of hyponatremia but her NA yesterday was 143.   Review of Systems:  Review of Systems  Constitutional: Positive for activity change and fatigue. Negative for fever, chills, diaphoresis, appetite change and unexpected weight change.  HENT: Negative for congestion, sore throat and trouble swallowing.        Dry mouth  Respiratory: Positive for shortness of breath. Negative for cough and wheezing.   Cardiovascular: Positive for leg swelling. Negative for chest pain and palpitations.  Gastrointestinal: Negative for abdominal pain, constipation, blood in stool and abdominal distention.  Genitourinary: Negative for dysuria and difficulty urinating.  Neurological: Positive for speech difficulty and weakness. Negative for dizziness, tremors, syncope, facial asymmetry and light-headedness.  Hematological: Bruises/bleeds easily.  Psychiatric/Behavioral: Negative for confusion and agitation.    Medications: Patient's Medications  New Prescriptions   No medications on file  Previous Medications   CALCIUM-VITAMIN D (OSCAL WITH D) 250-125 MG-UNIT PER TABLET    Take 1 tablet by mouth daily.    COLLAGENASE  (SANTYL) OINTMENT    Apply 1 application topically daily.   GLUCOSAMINE-CHONDROITIN 500-400 MG TABLET    Take 1 tablet by mouth daily.    IRON POLYSACCHARIDES (NIFEREX) 150 MG CAPSULE    Take 1 capsule (150 mg total) by mouth 2 (two) times daily. $RemoveBefo'325mg'coDFosTJywg$  daily   LEVOTHYROXINE (SYNTHROID, LEVOTHROID) 88 MCG TABLET    Take 88 mcg by mouth daily before breakfast.   MULTIPLE VITAMINS-MINERALS (CENTRUM SILVER PO)    Take by mouth daily.     NEBIVOLOL (BYSTOLIC) 10 MG TABLET    Take 10 mg by mouth daily.     PSYLLIUM (METAMUCIL) 58.6 % POWDER    Take 1 packet by mouth daily as needed.   VALSARTAN (DIOVAN PO)    Take 320 mg by mouth daily.    WARFARIN (COUMADIN) 4 MG TABLET    Take 1 tablet (4 mg total) by mouth daily at 6 PM.  Modified Medications   No medications on file  Discontinued Medications   AMLODIPINE (NORVASC) 2.5 MG TABLET    Take 2.5 mg by mouth daily.     Physical Exam:  Filed Vitals:   04/30/14 1454  BP: 133/76  Pulse: 72  Temp: 97.2 F (36.2 C)  Resp: 18  SpO2: 95%    Physical Exam  Constitutional: She is oriented to person, place, and time. No distress.  Frail, elderly female  HENT:  Head: Normocephalic and atraumatic.  Eyes: Conjunctivae and EOM are normal. Pupils are equal, round, and reactive to light. Right eye exhibits no discharge. Left eye exhibits no  discharge. No scleral icterus.  Neck: Normal range of motion. Neck supple. No JVD present. No tracheal deviation present. No thyromegaly present.  Cardiovascular: Normal rate, regular rhythm and normal heart sounds.   No murmur heard. Edema to both hands. Pitting +1 edema to right leg, no edema to left leg with venous stasis changes.  Pulmonary/Chest: Effort normal. No respiratory distress.  Decreased throughout with crackles to both bases  Abdominal: Soft. Bowel sounds are normal. She exhibits no distension. There is no tenderness.  Lymphadenopathy:    She has no cervical adenopathy.  Neurological: She is alert  and oriented to person, place, and time. No cranial nerve deficit. Coordination normal.  Skin: Skin is warm and dry. She is not diaphoretic.  Psychiatric: Affect and judgment normal.    Labs reviewed/Significant Diagnostic Results:  02/26/14:  CXR:  Nodular consolidation in right suprahilar region highly suspicious for mass or adenopathy. Less likely focal pneumonia. Further evaluation with CT scan of the chest is recommended.  03/28/14:  CXR:  1. Mild interval increase in the size of the right pleural effusion with new small left pleural effusion. There is new subsegmental atelectasis in the left mid and lower lung. 2. There is stable subtle soft tissue density in the right suprahilar region which may reflect a pulmonary parenchymal mass or lymphadenopathy. Chest CT scanning is recommended.  CXR 04/23/14:  Left sided pneumonia.    Basic Metabolic Panel:  Recent Labs  02/22/14 1110 03/22/14 1159 04/19/14 1135  NA 140 141 140  K 3.9 3.8 3.5  CO2 29 28 32*  GLUCOSE 104 86 89  BUN 31.7* 22.2 22.9  CREATININE 1.1 0.8 0.8  CALCIUM 9.6 9.1 9.2   Liver Function Tests:  Recent Labs  07/03/13 1105 07/31/13 1104 09/25/13 1125  AST 32 25 20  ALT 45 21 25  ALKPHOS 91 79 91  BILITOT 0.29 0.35 0.38  PROT 6.2* 6.3* 6.5  ALBUMIN 3.3* 3.2* 3.3*   No results for input(s): LIPASE, AMYLASE in the last 8760 hours. No results for input(s): AMMONIA in the last 8760 hours. CBC:  Recent Labs  03/22/14 1158 03/26/14 1115 04/19/14 1135  WBC 2.4* 3.7* 1.9*  NEUTROABS 1.5 2.8 1.3*  HGB 7.6* 7.8* 9.9*  HCT 24.0* 24.3* 31.3*  MCV 97.2 97.2 95.1  PLT 98* 97* 81*     Assessment/Plan Benign essential HTN D/C norvasc due to peripheral edema. Monitor BP.   Pneumonia Completed Avelox. Try to wean 02 for sat >= 92%   Long term current use of anticoagulant therapy Currently on coumadin $RemoveBef'4mg'oQQyIQyQdx$ , recheck INR   Edema Noted to hands and right lower ext. No recent echo on file. Last Na 143,  K 4.8. Give 1 dose of Lasix $Remove'40mg'EFwUNaj$  and attempt to wean off oxygen.     I spoke with her and she would like to be a DNR. She feels that she has lived a long, blessed life and given her current health status it is reasonable to do this.  Cindi Carbon, ANP [facility] [phone]

## 2014-04-30 NOTE — Assessment & Plan Note (Signed)
She has a hx of this and was take off Lasix on 04/24/14. She appears edematous today but her weight has remained stable in the past two days. I have asked the staff to check a BMP on 04/30/14 and monitor weights three times weekly.

## 2014-04-30 NOTE — Assessment & Plan Note (Signed)
INR elevated at 6.9, no sign of bleeding or focal deficit, recheck and treat with Vitamin K if necessary. Most likely elevated due to Avelox admin

## 2014-04-30 NOTE — Progress Notes (Signed)
  Patient ID: Heidi Pratt, female   DOB: 02/22/1919, 81 y.o.   MRN: [id number]  Nursing Home Location:    [facility], rehab section  Code Status: Full code   Chief Complaint  Patient presents with  . Acute Visit    slurred speech    HPI:  81 y.o. female  residing at [facility], rehab section. I was asked to see her today due to an episode this morning lasting about 10 minutes where she felt that she could speak or get her words out. There were no other focal issues per the staff assessment. She has a hx of dry mouth and some times it sounds as if she is slurring her words due to this issue. She has a hx of multiple myeloma, DVT, hyponatremia, and a questionable area in the right lung for cancer. She is followed by Dr. Sherrill with oncology. She was admitted to rehab at [facility] after a fall and respiratory problems, requiring oxygen. She is currently on Avelox for pneumonia. Her INR was 6.9 yesterday and coumadin placed on hold. Her symptoms have resolved and there are no residual deficits.  Review of Systems:  Review of Systems  Constitutional: Positive for activity change and fatigue. Negative for fever, chills, diaphoresis, appetite change and unexpected weight change.  HENT: Negative for congestion, sore throat and trouble swallowing.        Dry mouth  Respiratory: Positive for shortness of breath. Negative for cough and wheezing.   Cardiovascular: Positive for leg swelling. Negative for chest pain and palpitations.  Gastrointestinal: Negative for abdominal pain, constipation, blood in stool and abdominal distention.  Genitourinary: Negative for dysuria and difficulty urinating.  Neurological: Positive for speech difficulty and weakness. Negative for dizziness, tremors, syncope, facial asymmetry and light-headedness.  Hematological: Bruises/bleeds easily.  Psychiatric/Behavioral: Negative for confusion and agitation.     Medications: Patient's Medications  New Prescriptions   No medications on file  Previous Medications   AMLODIPINE (NORVASC) 2.5 MG TABLET    Take 2.5 mg by mouth daily.   CALCIUM-VITAMIN D (OSCAL WITH D) 250-125 MG-UNIT PER TABLET    Take 1 tablet by mouth daily.    COLLAGENASE (SANTYL) OINTMENT    Apply 1 application topically daily.   GLUCOSAMINE-CHONDROITIN 500-400 MG TABLET    Take 1 tablet by mouth daily.    IRON POLYSACCHARIDES (NIFEREX) 150 MG CAPSULE    Take 1 capsule (150 mg total) by mouth 2 (two) times daily. 325mg daily   LEVOTHYROXINE (SYNTHROID, LEVOTHROID) 88 MCG TABLET    Take 88 mcg by mouth daily before breakfast.   MULTIPLE VITAMINS-MINERALS (CENTRUM SILVER PO)    Take by mouth daily.     NEBIVOLOL (BYSTOLIC) 10 MG TABLET    Take 10 mg by mouth daily.     PSYLLIUM (METAMUCIL) 58.6 % POWDER    Take 1 packet by mouth daily as needed.   VALSARTAN (DIOVAN PO)    Take 320 mg by mouth daily.    WARFARIN (COUMADIN) 4 MG TABLET    Take 1 tablet (4 mg total) by mouth daily at 6 PM.  Modified Medications   No medications on file  Discontinued Medications   FUROSEMIDE (LASIX) 40 MG TABLET    Take 40 mg by mouth daily.     Physical Exam:  Filed Vitals:   04/30/14 0709  BP: 143/80  Pulse: 69  Temp: 98.6 F (37 C)  Resp: 20  Weight: 117 lb (53.071 kg)  SpO2:   95%    Physical Exam  Constitutional: She is oriented to person, place, and time. No distress.  Frail, elderly female  HENT:  Head: Normocephalic and atraumatic.  Eyes: Conjunctivae and EOM are normal. Pupils are equal, round, and reactive to light. Right eye exhibits no discharge. Left eye exhibits no discharge. No scleral icterus.  Neck: Normal range of motion. Neck supple. No JVD present. No tracheal deviation present. No thyromegaly present.  Cardiovascular: Normal rate, regular rhythm and normal heart sounds.   No murmur heard. Edema to both hands. Pitting +1 edema to right leg, no edema to left leg  with venous stasis changes.  Pulmonary/Chest: Effort normal. No respiratory distress.  Decreased throughout with crackles to both bases  Abdominal: Soft. Bowel sounds are normal. She exhibits no distension. There is no tenderness.  Lymphadenopathy:    She has no cervical adenopathy.  Neurological: She is alert and oriented to person, place, and time. No cranial nerve deficit. Coordination normal.  Skin: Skin is warm and dry. She is not diaphoretic.  Psychiatric: Affect and judgment normal.    Labs reviewed/Significant Diagnostic Results:  02/26/14:  CXR:  Nodular consolidation in right suprahilar region highly suspicious for mass or adenopathy. Less likely focal pneumonia. Further evaluation with CT scan of the chest is recommended.  03/28/14:  CXR:  1. Mild interval increase in the size of the right pleural effusion with new small left pleural effusion. There is new subsegmental atelectasis in the left mid and lower lung. 2. There is stable subtle soft tissue density in the right suprahilar region which may reflect a pulmonary parenchymal mass or lymphadenopathy. Chest CT scanning is recommended.  CXR 04/23/14:  Left sided pneumonia.    Basic Metabolic Panel:  Recent Labs  02/22/14 1110 03/22/14 1159 04/19/14 1135  NA 140 141 140  K 3.9 3.8 3.5  CO2 29 28 32*  GLUCOSE 104 86 89  BUN 31.7* 22.2 22.9  CREATININE 1.1 0.8 0.8  CALCIUM 9.6 9.1 9.2   Liver Function Tests:  Recent Labs  07/03/13 1105 07/31/13 1104 09/25/13 1125  AST 32 25 20  ALT 45 21 25  ALKPHOS 91 79 91  BILITOT 0.29 0.35 0.38  PROT 6.2* 6.3* 6.5  ALBUMIN 3.3* 3.2* 3.3*   No results for input(s): LIPASE, AMYLASE in the last 8760 hours. No results for input(s): AMMONIA in the last 8760 hours. CBC:  Recent Labs  03/22/14 1158 03/26/14 1115 04/19/14 1135  WBC 2.4* 3.7* 1.9*  NEUTROABS 1.5 2.8 1.3*  HGB 7.6* 7.8* 9.9*  HCT 24.0* 24.3* 31.3*  MCV 97.2 97.2 95.1  PLT 98* 97* 81*      Assessment/Plan Slurred speech This issue has resolved. ?TIA vs. Dry mouth? I spoke with her son, Dr. Serita Butcher, and he stated that this symptom has been misconstrued in the past due to her severe dry mouth. I mentioned that she referenced not being able to get her words out but that the issue had resolved. He is not interested at this time in more aggressive work up given her debilitated state. I have ordered to check her INR and we agreed to maintain her in therapeutic range until she is seen by Dr. Benay Spice with oncology. At that time, she will be evaluated for the continued need for coumadin.   Long term current use of anticoagulant therapy INR elevated at 6.9, no sign of bleeding or focal deficit, recheck and treat with Vitamin K if necessary. Most likely elevated due to Avelox  admin   Hyponatremia She has a hx of this and was take off Lasix on 04/24/14. She appears edematous today but her weight has remained stable in the past two days. I have asked the staff to check a BMP on 04/30/14 and monitor weights three times weekly.   I spent 45 min on this case in review of the record and in coordination with the POA, staff, and resident.   Cindi Carbon, ANP [facility] [phone]

## 2014-05-08 LAB — BASIC METABOLIC PANEL
BUN: 19 mg/dL (ref 4–21)
Creatinine: 0.7 mg/dL (ref 0.5–1.1)
Glucose: 85 mg/dL
Potassium: 4.1 mmol/L (ref 3.4–5.3)
Sodium: 140 mmol/L (ref 137–147)

## 2014-05-08 LAB — CBC AND DIFFERENTIAL
HEMATOCRIT: 29 % — AB (ref 36–46)
Hemoglobin: 9.5 g/dL — AB (ref 12.0–16.0)
Platelets: 90 10*3/uL — AB (ref 150–399)
WBC: 2.6 10*3/mL

## 2014-05-14 ENCOUNTER — Non-Acute Institutional Stay (SKILLED_NURSING_FACILITY): Payer: Medicare Other | Admitting: Adult Health

## 2014-05-14 DIAGNOSIS — R5381 Other malaise: Secondary | ICD-10-CM

## 2014-05-14 DIAGNOSIS — J90 Pleural effusion, not elsewhere classified: Secondary | ICD-10-CM

## 2014-05-14 DIAGNOSIS — R609 Edema, unspecified: Secondary | ICD-10-CM

## 2014-05-14 DIAGNOSIS — J189 Pneumonia, unspecified organism: Secondary | ICD-10-CM

## 2014-05-14 DIAGNOSIS — E871 Hypo-osmolality and hyponatremia: Secondary | ICD-10-CM

## 2014-05-14 DIAGNOSIS — J181 Lobar pneumonia, unspecified organism: Secondary | ICD-10-CM

## 2014-05-14 DIAGNOSIS — J948 Other specified pleural conditions: Secondary | ICD-10-CM

## 2014-05-14 NOTE — Progress Notes (Signed)
Patient ID: Heidi Pratt, female   DOB: March 29, 1919, 81 y.o.   MRN: [id number]     Nursing Home Location:  [facility]  rehab section  Code Status: Full code   Chief Complaint  Patient presents with  . Acute Visit    f/u edema, pna    HPI:  81 y.o. female  residing at [facility], rehab section. I am here to f/u after tx for pna and edema.   She has a hx of multiple myeloma, DVT, hyponatremia, and a questionable area in the superior right lung for cancer. She reports that she never had sputum production or fever or cough. She is still on oxygen and reports some DOE with activity but not at rest. Recently anticoagulation was discontinued due to the fact that she received >70mo and she is no longer on chemotherapy. She is working with therapy and reports difficulty with breathing during activity but not at night or at rest. She denies any pain to her chest. She is still requiring oxygen at 1L and has  arising C02 of 41 as of 05/08/14.     Review of Systems:  Review of Systems  Constitutional: Positive for activity change and fatigue. Negative for fever, chills, diaphoresis, appetite change and unexpected weight change.  HENT: Negative for congestion, sore throat and trouble swallowing.        Dry mouth  Respiratory: Positive for shortness of breath. Negative for cough and wheezing.   Cardiovascular: Positive for leg swelling. Negative for chest pain and palpitations.  Gastrointestinal: Negative for abdominal pain, constipation, blood in stool and abdominal distention.  Genitourinary: Negative for dysuria and difficulty urinating.  Skin: Positive for wound.  Neurological: Positive for weakness. Negative for dizziness, tremors, syncope, facial asymmetry, speech difficulty and light-headedness.  Hematological: Bruises/bleeds easily.  Psychiatric/Behavioral: Negative for confusion and agitation.    Medications: Patient's Medications  New Prescriptions    No medications on file  Previous Medications   ANTISEPTIC ORAL RINSE (BIOTENE) LIQD    15 mLs by Mouth Rinse route 2 times daily at 12 noon and 4 pm.   ASPIRIN 81 MG TABLET    Take 81 mg by mouth daily.   CALCIUM-VITAMIN D (OSCAL WITH D) 250-125 MG-UNIT PER TABLET    Take 1 tablet by mouth daily.    COLLAGENASE (SANTYL) OINTMENT    Apply 1 application topically daily.   GLUCOSAMINE-CHONDROITIN 500-400 MG TABLET    Take 1 tablet by mouth daily.    IRON POLYSACCHARIDES (NIFEREX) 150 MG CAPSULE    Take 1 capsule (150 mg total) by mouth 2 (two) times daily. $RemoveBefo'325mg'bZZEplBKkhM$  daily   LEVOTHYROXINE (SYNTHROID, LEVOTHROID) 88 MCG TABLET    Take 88 mcg by mouth daily before breakfast.   MULTIPLE VITAMINS-MINERALS (CENTRUM SILVER PO)    Take by mouth daily.     NEBIVOLOL (BYSTOLIC) 10 MG TABLET    Take 10 mg by mouth daily.     PSYLLIUM (METAMUCIL) 58.6 % POWDER    Take 1 packet by mouth daily as needed.   VALSARTAN (DIOVAN PO)    Take 320 mg by mouth daily.   Modified Medications   No medications on file  Discontinued Medications   WARFARIN (COUMADIN) 4 MG TABLET    Take 1 tablet (4 mg total) by mouth daily at 6 PM.     Physical Exam:  Filed Vitals:   05/14/14 1129  BP: 120/67  Pulse: 71  Temp: 97.5 F (36.4 C)  Resp: 22  Weight: 116 lb 9.6 oz (52.889 kg)  SpO2: 99%    Physical Exam  Constitutional: She is oriented to person, place, and time. No distress.  Frail, elderly female  HENT:  Head: Normocephalic and atraumatic.  Eyes: Conjunctivae and EOM are normal. Pupils are equal, round, and reactive to light. Right eye exhibits no discharge. Left eye exhibits no discharge. No scleral icterus.  Neck: Normal range of motion. Neck supple. No JVD present. No tracheal deviation present. No thyromegaly present.  Cardiovascular: Normal rate, regular rhythm and normal heart sounds.   No murmur heard. Edema to both hands left worse than right. Pitting +1 edema to right leg, no edema to left leg with  venous stasis changes.  Pulmonary/Chest: Effort normal. No respiratory distress.  Decreased throughout with crackles to both bases  Abdominal: Soft. Bowel sounds are normal. She exhibits no distension. There is no tenderness.  Lymphadenopathy:    She has no cervical adenopathy.  Neurological: She is alert and oriented to person, place, and time. No cranial nerve deficit. Coordination normal.  Skin: Skin is warm and dry. She is not diaphoretic.  Psychiatric: Affect and judgment normal.    Labs reviewed/Significant Diagnostic Results:  02/26/14:  CXR:  Nodular consolidation in right suprahilar region highly suspicious for mass or adenopathy. Less likely focal pneumonia. Further evaluation with CT scan of the chest is recommended.  03/28/14:  CXR:  1. Mild interval increase in the size of the right pleural effusion with new small left pleural effusion. There is new subsegmental atelectasis in the left mid and lower lung. 2. There is stable subtle soft tissue density in the right suprahilar region which may reflect a pulmonary parenchymal mass or lymphadenopathy. Chest CT scanning is recommended.  CXR 04/23/14:  Left sided pneumonia.    Basic Metabolic Panel:  Recent Labs  02/22/14 1110 03/22/14 1159 04/19/14 1135 04/29/14 05/08/14  NA 140 141 140 143 140  K 3.9 3.8 3.5  --  4.1  CO2 29 28 32*  --   --   GLUCOSE 104 86 89  --   --   BUN 31.7* 22.2 22.9 24* 19  CREATININE 1.1 0.8 0.8 0.8 0.7  CALCIUM 9.6 9.1 9.2  --   --    Liver Function Tests:  Recent Labs  07/03/13 1105 07/31/13 1104 09/25/13 1125  AST 32 25 20  ALT 45 21 25  ALKPHOS 91 79 91  BILITOT 0.29 0.35 0.38  PROT 6.2* 6.3* 6.5  ALBUMIN 3.3* 3.2* 3.3*   No results for input(s): LIPASE, AMYLASE in the last 8760 hours. No results for input(s): AMMONIA in the last 8760 hours. CBC:  Recent Labs  03/22/14 1158 03/26/14 1115 04/19/14 1135 04/29/14 05/08/14  WBC 2.4* 3.7* 1.9* 2.8 2.6  NEUTROABS 1.5 2.8 1.3*   --   --   HGB 7.6* 7.8* 9.9* 9.5* 9.5*  HCT 24.0* 24.3* 31.3* 29* 29*  MCV 97.2 97.2 95.1  --   --   PLT 98* 97* 81* 81* 90*     Assessment/Plan  1. Edema Improved to right leg, worse to left arm. ?dependency vs. compression of a vessel from malignancy. Continue to elevated left upper ext. Off coumadin, would not resume due to goals of care, see below.   2. Left lower lobe pneumonia No sputum production or fever after completions of Avelox  3. Hyponatremia Improved,  off norvasc  4. Pleural effusion, right Continues to require oxygen and has dyspnea. This could be due to  a progression in the effusion or mass. Continue to titrate oxygen for sat>90% and comfort.  5. Debility Declining overall in the past two months. See below.  Continue PT.  After speaking with her son, Dr. Serita Butcher, we have decided not to perform any further CXRs as this will not change the plan of care. She had an apt with Dr. Learta Codding with oncology but this will be canceled because it has become so difficult for her to leave the facility. Dr. Serita Butcher will discuss her case with Dr. Learta Codding and get back with [place]. She will remain a DNR with no aggressive measures. We discuss Hospice care and they will most likely accept this in the near future.     Cindi Carbon, ANP [facility] [phone]

## 2014-05-15 ENCOUNTER — Telehealth: Payer: Self-pay | Admitting: *Deleted

## 2014-05-15 NOTE — Telephone Encounter (Signed)
Call from pt's son reporting she is not doing well and may need to cancel upcoming appts. Requests to speak with Dr. Benay Spice directly. Request to Dr. Benay Spice.

## 2014-05-16 ENCOUNTER — Telehealth: Payer: Self-pay

## 2014-05-16 ENCOUNTER — Telehealth: Payer: Self-pay | Admitting: *Deleted

## 2014-05-16 NOTE — Telephone Encounter (Signed)
[place] [place] for [facility] hospice referral, diagnosis is multiple myeloma

## 2014-05-16 NOTE — Telephone Encounter (Signed)
Called [facility] to make nursing staff aware that hospice referral has been faxed to hospice.  Spoke with Candida Peeling, RN.

## 2014-05-16 NOTE — Telephone Encounter (Signed)
Dr. Serita Butcher called requesting a Hospice Referral for his Mom.  He said Dr. Benay Spice said they could do it at any time and they have talked with her about it and are ready to have the referral.  She is at [facility] in [place].  Room 148.  Will route this to Dr. Benay Spice and Rosalio Macadamia RN

## 2014-05-16 NOTE — Telephone Encounter (Signed)
Hospice referral faxed to [place].

## 2014-05-16 NOTE — Telephone Encounter (Signed)
Dr. Benay Spice spoke with pt's son. Pt seems to be declining. Now in skilled nursing, considering hospice. Dr. Serita Butcher will call the office if he needs to cancel her appt.

## 2014-05-24 ENCOUNTER — Other Ambulatory Visit: Payer: Medicare Other

## 2014-05-31 ENCOUNTER — Ambulatory Visit: Payer: Medicare Other | Admitting: Oncology

## 2014-06-05 DEATH — deceased

## 2015-02-15 IMAGING — CT CT CERVICAL SPINE W/O CM
3 of 5 series · 14 of 29 positions shown, 15 images · non-contrast
Comparison: 08/26/2012 most recent, also 03/15/2010 CT.

CLINICAL DATA: Acute-on-chronic C2 fractures.  Most recent injury
August 2012. Bilateral hand numbness.  No pain.

CT CERVICAL SPINE WITHOUT CONTRAST
TECHNIQUE: Multidetector CT imaging of the cervical spine was
performed. Multiplanar CT image reconstructions were also
generated.

[Series 2: c spine bone · axial · 0.25mm/px · z∈[-203,-96]mm · 4 of 73 slices shown, 5 images]
[im 15/73  soft-tissue]
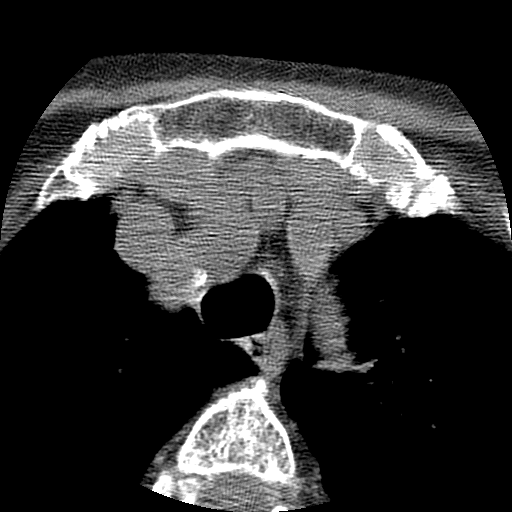
[im 15/73  bone]
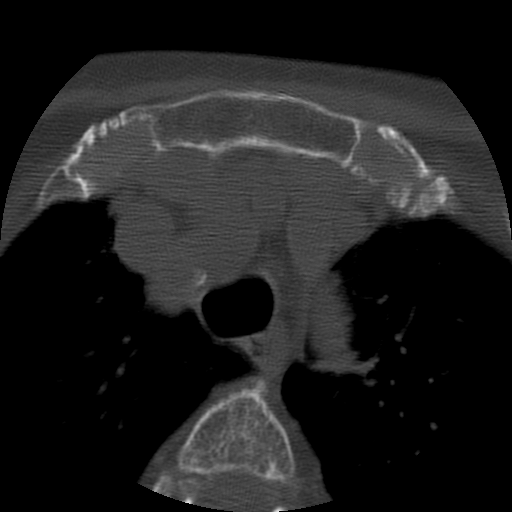
[im 29/73  bone]
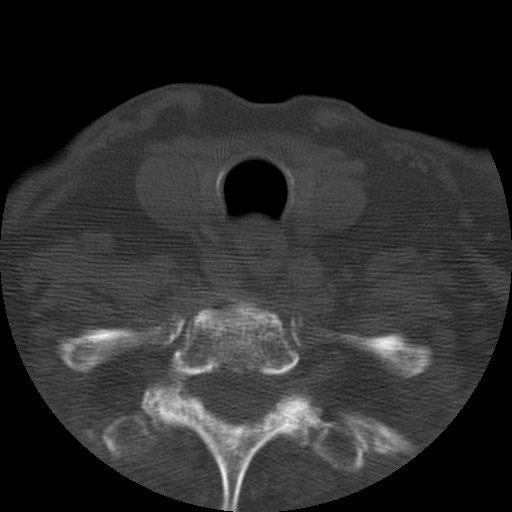
[im 44/73  bone]
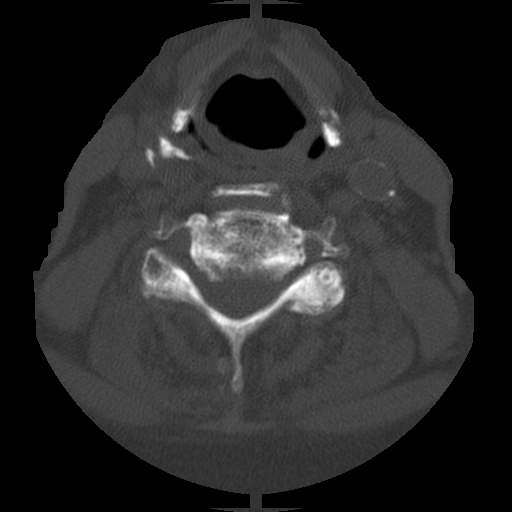
[im 58/73  bone]
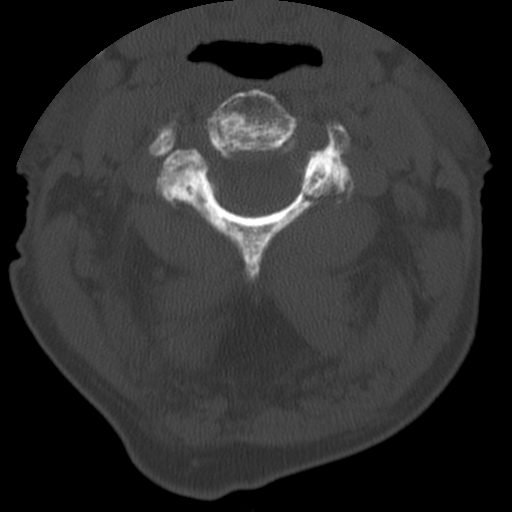

[Series 3: c spine soft · axial · 0.25mm/px · z∈[-203,-96]mm · 4 of 73 slices shown]
[im 15/73  soft-tissue]
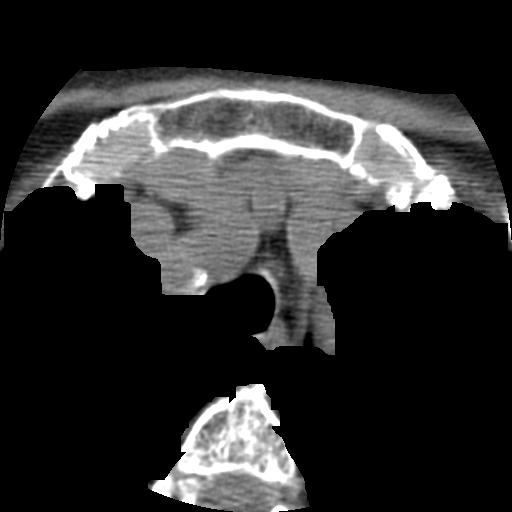
[im 29/73  soft-tissue]
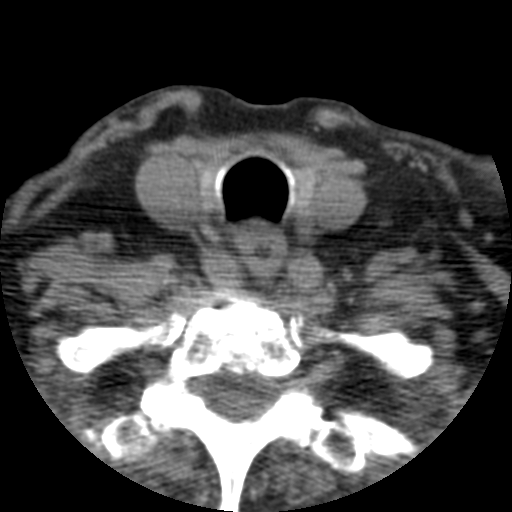
[im 44/73  soft-tissue]
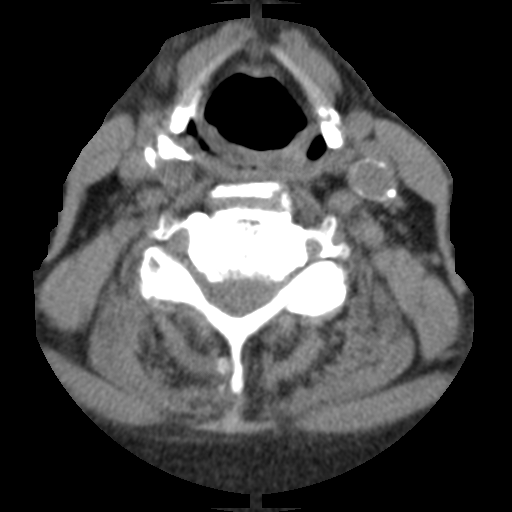
[im 58/73  soft-tissue]
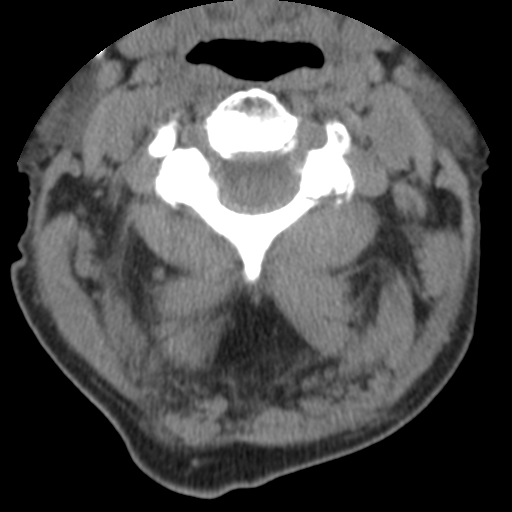

[Series 400: cor upper · coronal · 0.36mm/px · 6 of 39 slices shown]
[im 2/39  soft-tissue]
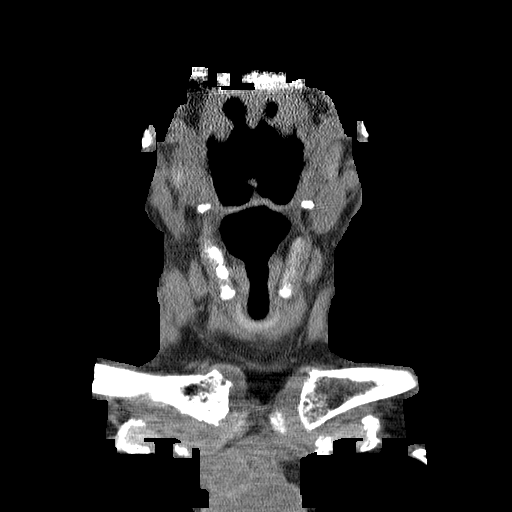
[im 7/39  bone]
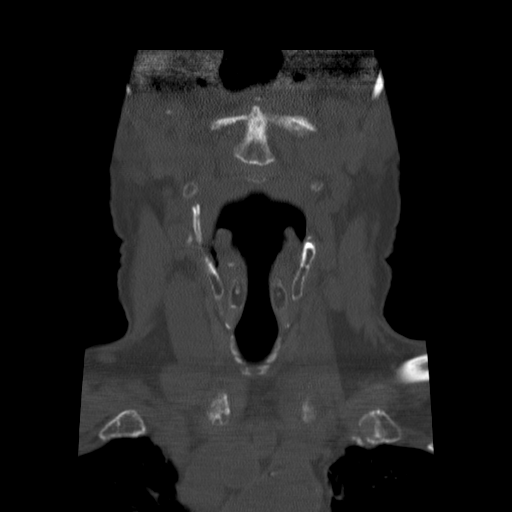
[im 13/39  bone]
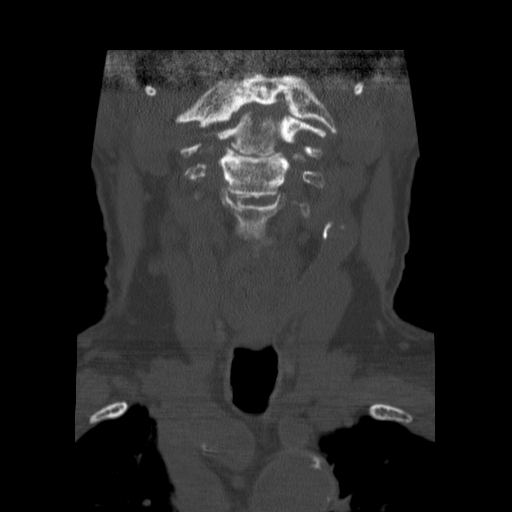
[im 20/39  bone]
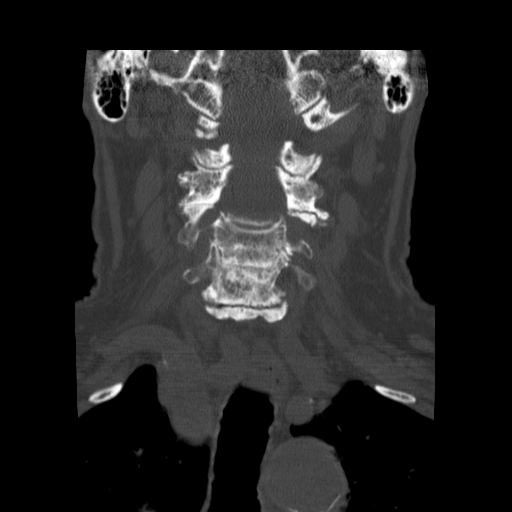
[im 26/39  bone]
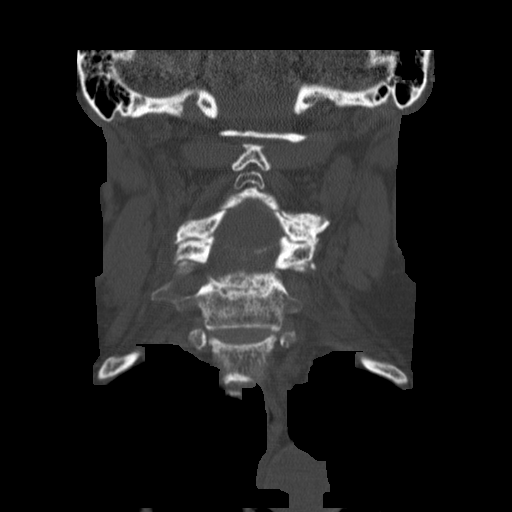
[im 32/39  bone]
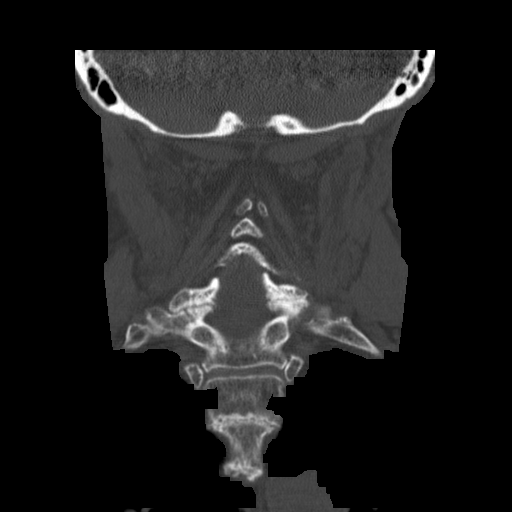

[14 of 29 positions shown; findings below may reference images not displayed]

FINDINGS: Chronic type 2 odontoid fracture is redemonstrated.
There is extensive pannus.  The margins are rounded and well
corticated suggesting this is an old injury.  This appearance was
noted on prior CT in 8366.  There is a prior MRI from 7442 which
shows the odontoid to be fractured.

not healed.  There is an ununited fracture of the right pedicle of
C2.  There is an ununited ring fracture of the left posterior
lamina of C2 which extends into the left facet.  There is severe
joint space narrowing of the left C2-3 facet.

Difficult to appreciate on the previous CT, but probably present,
are bilateral ring fractures of C1 posteriorly.  These are noted on
image 14 series 2 of the current study.  The appearance is
unchanged from [DATE], in that there is no interval healing of these
fractures either. The anterior arch of C1 is fused to the odontoid
tip.

Multilevel spondylosis from C2-T1 is unchanged.  Severe disc space
narrowing at C5-6 and C6-7 is present most notably.
IMPRESSION: Bilateral C2 fractures involve the right pedicle and left
lamina/facet have not healed.  Ununited type 2 odontoid fracture
appears unchanged from 7442.  Moderate pannus remains.

Difficult to appreciate in retrospect, but probably present, are
bilateral posterior arch fractures of C1.  These remain ununited as
do the C2 pedicle and laminar arch fractures.

## 2015-02-16 IMAGING — CR DG CHEST 2V
2 series · 2 of 2 positions shown · non-contrast
Comparison: Multiple priors, most recent 10/18/2012

CLINICAL DATA: Follow-up malignant effusion/ myeloma.

CHEST - 2 VIEW

[w chest pa]
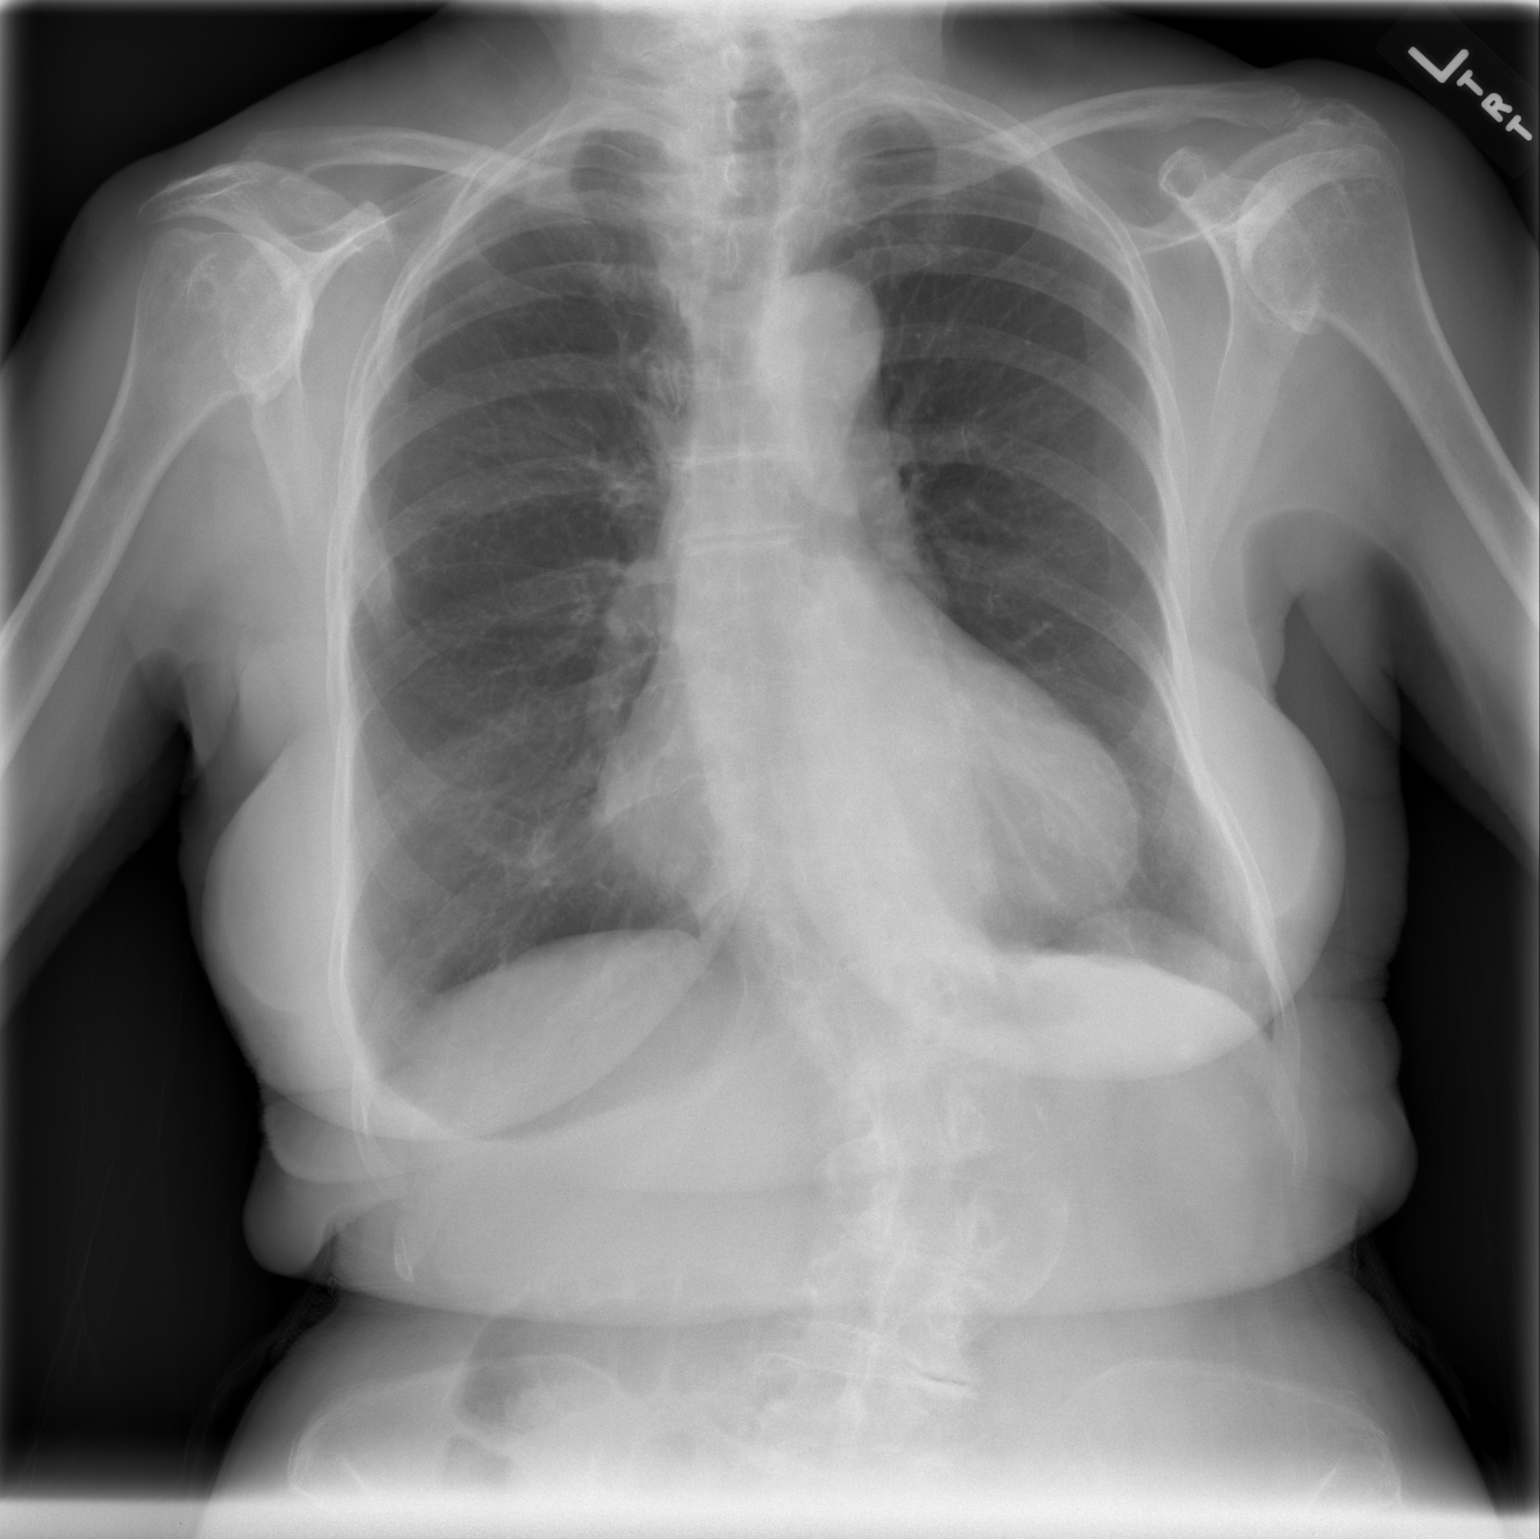

[w chest lat]
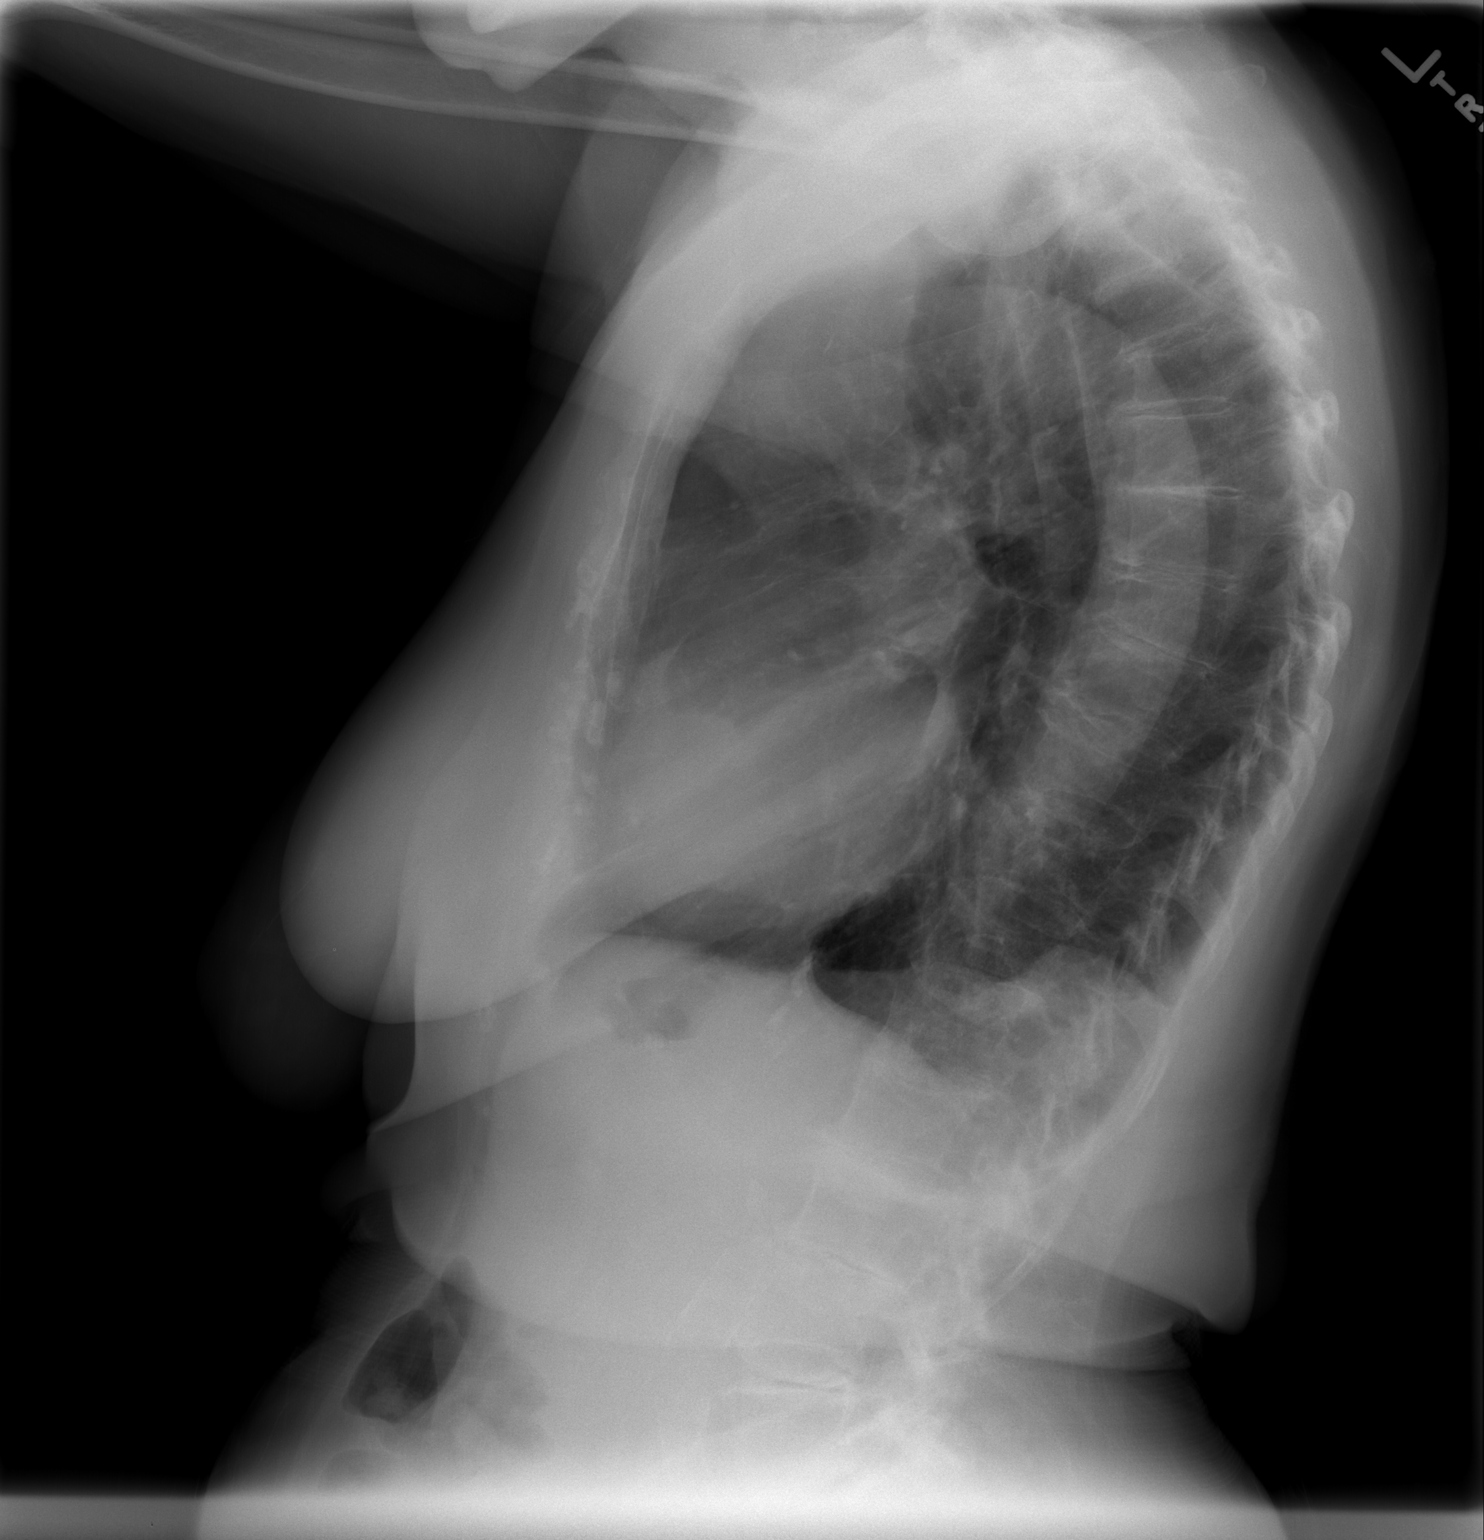

[2 of 2 positions shown; findings below may reference images not displayed]

FINDINGS: Cardiac silhouette is unchanged. Improvement in right
pleural effusion. No pneumothorax. No edema or infiltrate. Bones
are unchanged in appearance.
IMPRESSION: Improvement in right pleural effusion.

## 2015-02-16 IMAGING — CR DG CHEST DECUBITUS*R*
2 series · 2 of 2 positions shown · non-contrast
Comparison: Concurrent AP/lateral chest radiographs dated
12/02/2012.  Decubitus radiographs dated 10/18/2012.

CLINICAL DATA: Multiple myeloma, malignant effusion

CHEST - RIGHT DECUBITUS

[w chest decub. (1 of 2)]
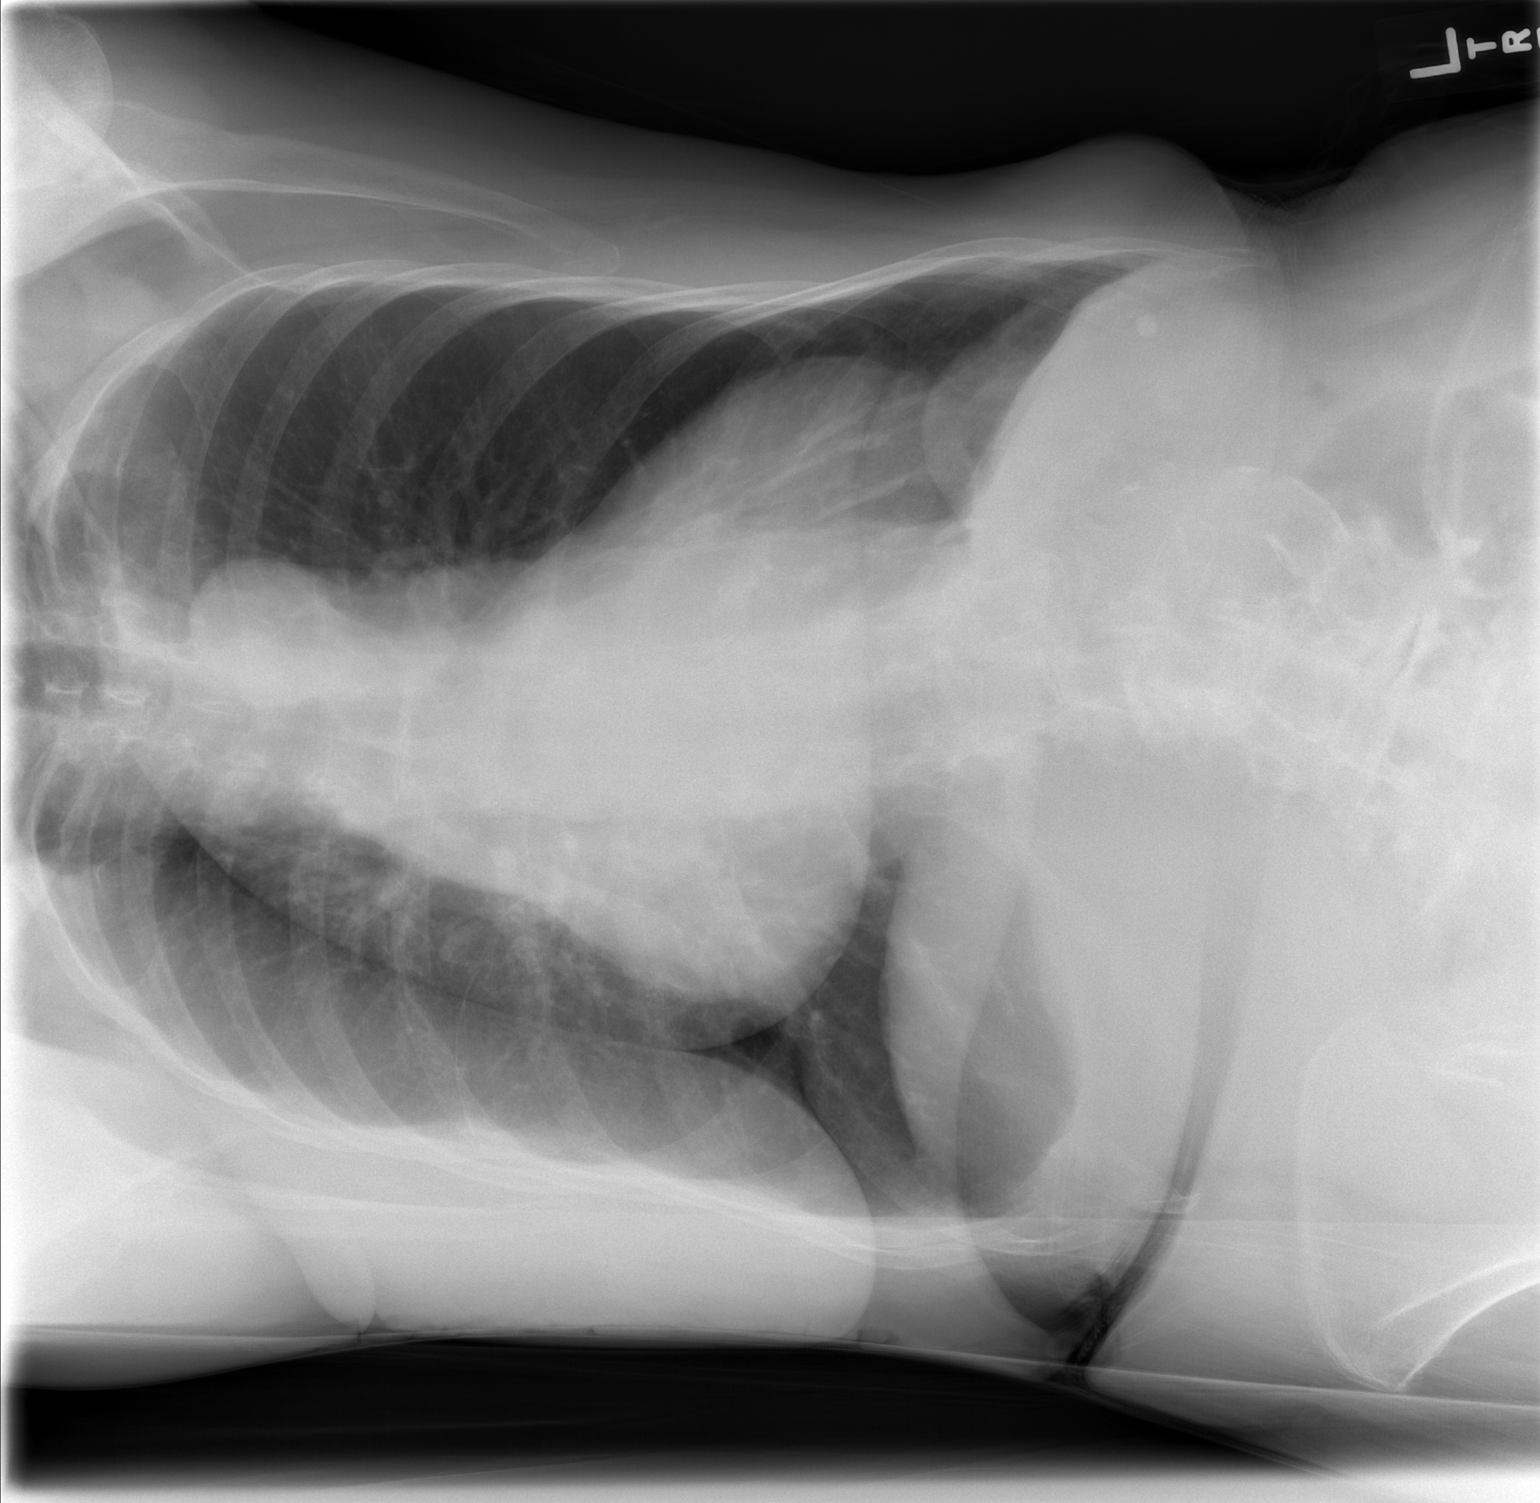

[w chest decub. (2 of 2)]
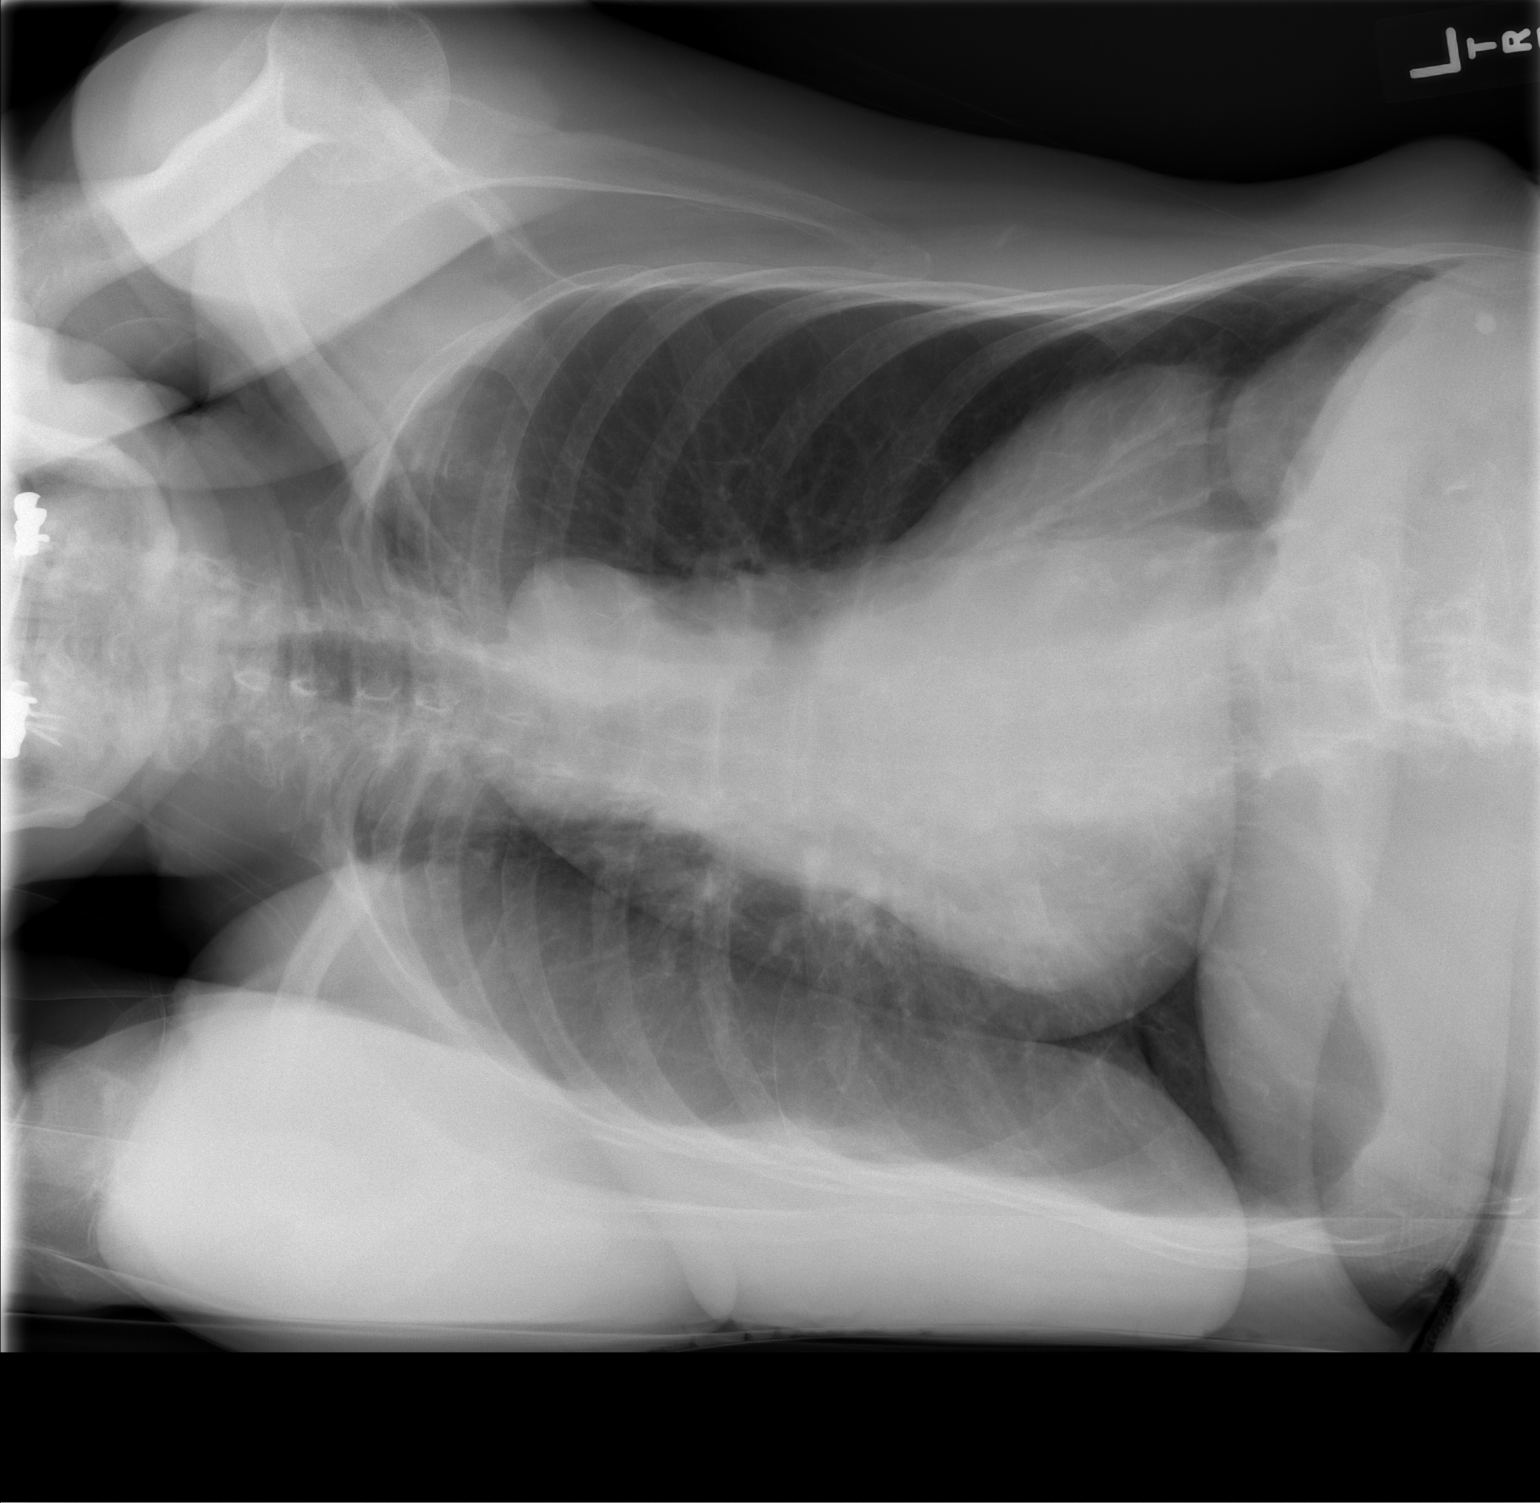

[2 of 2 positions shown; findings below may reference images not displayed]

FINDINGS: Tiny layering right pleural effusion, improved.

Lungs are essentially clear.  No pneumothorax.

Cardiomegaly.
IMPRESSION: Tiny layering right pleural effusion, improved.

## 2015-03-19 NOTE — Progress Notes (Signed)
This encounter was created in error - please disregard.
